# Patient Record
Sex: Female | Born: 1965 | Race: Black or African American | Hispanic: No | Marital: Married | Smoking: Never smoker
Health system: Southern US, Community
[De-identification: ages and names within clinical notes are randomized; demographics above are authoritative.]

## PROBLEM LIST (undated history)

## (undated) DIAGNOSIS — R51 Headache: Secondary | ICD-10-CM

## (undated) DIAGNOSIS — E785 Hyperlipidemia, unspecified: Secondary | ICD-10-CM

## (undated) DIAGNOSIS — T7840XA Allergy, unspecified, initial encounter: Secondary | ICD-10-CM

## (undated) DIAGNOSIS — R519 Headache, unspecified: Secondary | ICD-10-CM

## (undated) HISTORY — DX: Headache: R51

## (undated) HISTORY — DX: Headache, unspecified: R51.9

## (undated) HISTORY — PX: APPENDECTOMY: SHX54

## (undated) HISTORY — PX: UTERINE FIBROID SURGERY: SHX826

## (undated) HISTORY — DX: Hyperlipidemia, unspecified: E78.5

## (undated) HISTORY — DX: Allergy, unspecified, initial encounter: T78.40XA

---

## 2010-07-21 ENCOUNTER — Emergency Department (HOSPITAL_COMMUNITY)
Admission: EM | Admit: 2010-07-21 | Discharge: 2010-07-21 | Disposition: A | Discharge status: Home / Self Care | Payer: Self-pay | Attending: Emergency Medicine | Admitting: Emergency Medicine

## 2010-07-21 DIAGNOSIS — N898 Other specified noninflammatory disorders of vagina: Secondary | ICD-10-CM | POA: Insufficient documentation

## 2012-09-16 ENCOUNTER — Ambulatory Visit (INDEPENDENT_AMBULATORY_CARE_PROVIDER_SITE_OTHER): Payer: Managed Care, Other (non HMO) | Admitting: Family Medicine

## 2012-09-16 VITALS — BP 128/84 | HR 84 | Temp 98.7°F | Resp 18 | Ht 67.0 in | Wt 237.0 lb

## 2012-09-16 DIAGNOSIS — J019 Acute sinusitis, unspecified: Secondary | ICD-10-CM

## 2012-09-16 DIAGNOSIS — J309 Allergic rhinitis, unspecified: Secondary | ICD-10-CM

## 2012-09-16 MED ORDER — PREDNISONE 10 MG PO TABS: ORAL_TABLET | ORAL | Status: DC

## 2012-09-16 MED ORDER — FLUTICASONE PROPIONATE 50 MCG/ACT NA SUSP: 2.0000 | Freq: Every day | NASAL | Status: DC

## 2012-09-16 MED ORDER — AMOXICILLIN-POT CLAVULANATE 875-125 MG PO TABS: 1.0000 | ORAL_TABLET | Freq: Two times a day (BID) | ORAL | Status: DC

## 2012-09-16 NOTE — Progress Notes (Signed)
  [facility]:  Office Visit  Chief Complaint:  Chief Complaint  Patient presents with  . Nasal Congestion    runny nose/sneezing/ cough    HPI: Grace Quinn is a 47 y.o. female who complains of  Had URI sxs. Chills, fevers, cough x 1 week. Eye pain, swelling, watery dc. + allergies. From [place]. Has had sxs like pollen. Has taken Claritin everyday for 1 week. No releif. This morning took advil. + clear sputum.   Past Medical History  Diagnosis Date  . Allergy    Past Surgical History  Procedure Laterality Date  . Uterine fibroid surgery     History   Social History  . Marital Status: Single    Spouse Name: N/A    Number of Children: N/A  . Years of Education: N/A   Social History Main Topics  . Smoking status: Never Smoker   . Smokeless tobacco: None  . Alcohol Use: None  . Drug Use: None  . Sexually Active: None   Other Topics Concern  . None   Social History Narrative  . None   History reviewed. No pertinent family history. No Known Allergies Prior to Admission medications   Medication Sig Start Date End Date Taking? Authorizing Provider  ibuprofen (ADVIL,MOTRIN) 200 MG tablet Take 200 mg by mouth every 6 (six) hours as needed for pain.   Yes Historical Provider, MD     ROS: The patient denies fevers, chills, night sweats, unintentional weight loss, chest pain, palpitations, wheezing, dyspnea on exertion, nausea, vomiting, abdominal pain, dysuria, hematuria, melena, numbness, weakness, or tingling.   All other systems have been reviewed and were otherwise negative with the exception of those mentioned in the HPI and as above.    PHYSICAL EXAM: Filed Vitals:   09/16/12 1622  BP: 128/84  Pulse: 84  Temp: 98.7 F (37.1 C)  Resp: 18   Filed Vitals:   09/16/12 1622  Height: 5\' 7"  (1.702 m)  Weight: 237 lb (107.502 kg)   Body mass index is 37.11 kg/(m^2).  General: Alert, no acute distress HEENT:  Normocephalic, atraumatic,  oropharynx patent. + sinus draiange, , tenderness, + boggy nares. Tm nl,. No exudates Cardiovascular:  Regular rate and rhythm, no rubs murmurs or gallops.  No Carotid bruits, radial pulse intact. No pedal edema.  Respiratory: Clear to auscultation bilaterally.  No wheezes, rales, or rhonchi.  No cyanosis, no use of accessory musculature GI: No organomegaly, abdomen is soft and non-tender, positive bowel sounds.  No masses. Skin: No rashes. Neurologic: Facial musculature symmetric. Psychiatric: Patient is appropriate throughout our interaction. Lymphatic: No cervical lymphadenopathy Musculoskeletal: Gait intact.   LABS: Results for orders placed during the hospital encounter of 07/21/10  POCT PREGNANCY, URINE      Result Value Range   Preg Test, Ur       Value: NEGATIVE            THE SENSITIVITY OF THIS     METHODOLOGY IS >24 mIU/mL     EKG/XRAY:   Primary read interpreted by Dr. Conley Rolls at [facility]   ASSESSMENT/PLAN: Encounter Diagnoses  Name Primary?  . Acute sinusitis Yes  . Allergic rhinitis     Rx Augmentin, Flonase and Prednisone Taper.  F/u prn   Grace Lecompte PHUONG, DO 09/16/2012 5:00 PM

## 2013-02-08 ENCOUNTER — Encounter: Payer: Self-pay | Admitting: Obstetrics & Gynecology

## 2013-02-24 ENCOUNTER — Ambulatory Visit: Payer: Managed Care, Other (non HMO) | Admitting: Obstetrics

## 2013-02-24 ENCOUNTER — Ambulatory Visit (INDEPENDENT_AMBULATORY_CARE_PROVIDER_SITE_OTHER): Payer: Managed Care, Other (non HMO) | Admitting: Obstetrics

## 2013-02-24 ENCOUNTER — Encounter: Payer: Self-pay | Admitting: Obstetrics

## 2013-02-24 VITALS — BP 137/92 | HR 86 | Temp 98.4°F | Ht 65.0 in | Wt 237.4 lb

## 2013-02-24 DIAGNOSIS — R51 Headache: Secondary | ICD-10-CM

## 2013-02-24 DIAGNOSIS — Z0000 Encounter for general adult medical examination without abnormal findings: Secondary | ICD-10-CM

## 2013-02-24 DIAGNOSIS — R87610 Atypical squamous cells of undetermined significance on cytologic smear of cervix (ASC-US): Secondary | ICD-10-CM

## 2013-02-24 DIAGNOSIS — R519 Headache, unspecified: Secondary | ICD-10-CM | POA: Insufficient documentation

## 2013-02-24 MED ORDER — PNV PRENATAL PLUS MULTIVITAMIN 27-1 MG PO TABS: 1.0000 | ORAL_TABLET | Freq: Every day | ORAL | Status: DC

## 2013-02-24 MED ORDER — BUTALBITAL-APAP-CAFFEINE 50-325-40 MG PO TABS: 2.0000 | ORAL_TABLET | Freq: Four times a day (QID) | ORAL | Status: DC | PRN

## 2013-02-24 NOTE — Progress Notes (Signed)
.   Subjective:     Grace Quinn is a 47 y.o. female here for a problem exam.  Current complaints: patient is here today for a consult regarding her abnormal pap smear done 02/01/13 at [facility].  Personal health questionnaire reviewed: yes.   Gynecologic History No LMP recorded. Patient is not currently having periods (Reason: Perimenopausal). Contraception: none Last Pap: 02/01/2013. Results were: abnormal Last mammogram: 2013. Results were: normal  Obstetric History OB History  No data available     The following portions of the patient's history were reviewed and updated as appropriate: allergies, current medications, past family history, past medical history, past social history, past surgical history and problem list.  Review of Systems Pertinent items are noted in HPI.    Objective:    PE:  Omitted.  Consult only.  Assessment:    [facility] Pap Smear.  Headache.   Plan:    Education reviewed: Management of abnormal pap smears.. Follow up in: 6 months.   Repeat Pap q 6 Months. Fioricet Rx.

## 2013-07-26 ENCOUNTER — Ambulatory Visit: Payer: Managed Care, Other (non HMO) | Admitting: Obstetrics

## 2013-10-07 ENCOUNTER — Ambulatory Visit (INDEPENDENT_AMBULATORY_CARE_PROVIDER_SITE_OTHER): Payer: Managed Care, Other (non HMO) | Admitting: Physician Assistant

## 2013-10-07 VITALS — BP 110/70 | HR 72 | Temp 97.6°F | Resp 16 | Ht 66.0 in | Wt 235.0 lb

## 2013-10-07 DIAGNOSIS — R519 Headache, unspecified: Secondary | ICD-10-CM

## 2013-10-07 DIAGNOSIS — H1010 Acute atopic conjunctivitis, unspecified eye: Secondary | ICD-10-CM

## 2013-10-07 DIAGNOSIS — J309 Allergic rhinitis, unspecified: Secondary | ICD-10-CM

## 2013-10-07 DIAGNOSIS — R51 Headache: Secondary | ICD-10-CM

## 2013-10-07 MED ORDER — BUTALBITAL-APAP-CAFFEINE 50-325-40 MG PO TABS: 2.0000 | ORAL_TABLET | Freq: Three times a day (TID) | ORAL | Status: DC | PRN

## 2013-10-07 MED ORDER — PREDNISONE 50 MG PO TABS: 50.0000 mg | ORAL_TABLET | Freq: Every day | ORAL | Status: DC

## 2013-10-07 MED ORDER — MOMETASONE FUROATE 50 MCG/ACT NA SUSP: 2.0000 | Freq: Every day | NASAL | Status: DC

## 2013-10-07 MED ORDER — AZELASTINE HCL 0.05 % OP SOLN: 1.0000 [drp] | Freq: Two times a day (BID) | OPHTHALMIC | Status: DC

## 2013-10-07 NOTE — Progress Notes (Signed)
   Subjective:    Patient ID: Grace Quinn, female    DOB: 03/28/1966, 48 y.o.   MRN: [id number]  HPI   [name] is a pleasant 48 yr old female here because "I'm sick."  She thinks this may be due allergies.  Symptoms include headache, nasal congestion, runny nose, itchy/watery eyes.  Symptoms have been present 2 wks.  No fever.  No cough.  No sick contacts.  Has been treated for allergies in the past.  Tried Zyrtec D and Flonase without effect.  Tried Claritin today  Also would like refill of Fioricet - takes prn for headaches   Review of Systems  Constitutional: Negative for fever and chills.  HENT: Positive for congestion, rhinorrhea, sinus pressure and sneezing. Negative for ear pain and sore throat.   Respiratory: Negative for cough, shortness of breath and wheezing.   Cardiovascular: Negative.   Gastrointestinal: Negative.   Musculoskeletal: Negative.   Skin: Negative.   Neurological: Positive for headaches.       Objective:   Physical Exam  Vitals reviewed. Constitutional: She is oriented to person, place, and time. She appears well-developed and well-nourished. No distress.  HENT:  Head: Normocephalic and atraumatic.  Right Ear: Tympanic membrane and ear canal normal.  Left Ear: Tympanic membrane and ear canal normal.  Nose: Rhinorrhea present.  Mouth/Throat: Uvula is midline, oropharynx is clear and moist and mucous membranes are normal.  Eyes: Conjunctivae are normal. No scleral icterus.  Neck: Neck supple.  Cardiovascular: Normal rate, regular rhythm and normal heart sounds.   Pulmonary/Chest: Effort normal and breath sounds normal. She has no wheezes. She has no rales.  Lymphadenopathy:    She has no cervical adenopathy.  Neurological: She is alert and oriented to person, place, and time.  Skin: Skin is warm and dry.  Psychiatric: She has a normal mood and affect. Her behavior is normal.       Assessment & Plan:  Allergic conjunctivitis and rhinitis - Plan:  mometasone (NASONEX) 50 MCG/ACT nasal spray, azelastine (OPTIVAR) 0.05 % ophthalmic solution, predniSONE (DELTASONE) 50 MG tablet  Headache - Plan: butalbital-acetaminophen-caffeine (FIORICET) 50-325-40 MG per tablet   [name] is a very pleasant 48 yr old female here with allergic rhinitis and conjunctivitis.  Continue Claritin daily.  Switch from Flonase to Nasonex.  Optivar for eye symptoms. Short burst of prednisone.  Fioricet refilled per pt request.    Pt to call or RTC if worsening or not improving  E. Frances FurbishElizabeth Jevonte Clanton MHS, PA-C [facility] 5/14/20158:46 PM

## 2013-10-07 NOTE — Patient Instructions (Signed)
Continue taking the loratadine once daily  Use the mometasone (Nasaonex) 2 sprays each nostril once daily - make sure to use this consistently every day for the best results  Use the azelastine (Optivar) drops twice daily for your eyes  Take prednisone (steroid) for 3 days to help calm down your allergies  If any symptoms are worsening or not improving, please let me know   Allergic Rhinitis Allergic rhinitis is when the mucous membranes in the nose respond to allergens. Allergens are particles in the air that cause your body to have an allergic reaction. This causes you to release allergic antibodies. Through a chain of events, these eventually cause you to release histamine into the blood stream. Although meant to protect the body, it is this release of histamine that causes your discomfort, such as frequent sneezing, congestion, and an itchy, runny nose.  CAUSES  Seasonal allergic rhinitis (hay fever) is caused by pollen allergens that may come from grasses, trees, and weeds. Year-round allergic rhinitis (perennial allergic rhinitis) is caused by allergens such as house dust mites, pet dander, and mold spores.  SYMPTOMS   Nasal stuffiness (congestion).  Itchy, runny nose with sneezing and tearing of the eyes. DIAGNOSIS  Your health care provider can help you determine the allergen or allergens that trigger your symptoms. If you and your health care provider are unable to determine the allergen, skin or blood testing may be used. TREATMENT  Allergic Rhinitis does not have a cure, but it can be controlled by:  Medicines and allergy shots (immunotherapy).  Avoiding the allergen. Hay fever may often be treated with antihistamines in pill or nasal spray forms. Antihistamines block the effects of histamine. There are over-the-counter medicines that may help with nasal congestion and swelling around the eyes. Check with your health care provider before taking or giving this medicine.  If  avoiding the allergen or the medicine prescribed do not work, there are many new medicines your health care provider can prescribe. Stronger medicine may be used if initial measures are ineffective. Desensitizing injections can be used if medicine and avoidance does not work. Desensitization is when a patient is given ongoing shots until the body becomes less sensitive to the allergen. Make sure you follow up with your health care provider if problems continue. HOME CARE INSTRUCTIONS It is not possible to completely avoid allergens, but you can reduce your symptoms by taking steps to limit your exposure to them. It helps to know exactly what you are allergic to so that you can avoid your specific triggers. SEEK MEDICAL CARE IF:   You have a fever.  You develop a cough that does not stop easily (persistent).  You have shortness of breath.  You start wheezing.  Symptoms interfere with normal daily activities. Document Released: 02/05/2001 Document Revised: 03/03/2013 Document Reviewed: 01/18/2013 [facility] Patient Information 2014 [employer] [place].

## 2013-11-13 ENCOUNTER — Other Ambulatory Visit: Payer: Self-pay | Admitting: Physician Assistant

## 2013-11-15 NOTE — Telephone Encounter (Signed)
Rx faxed. LMOM that I faxed rx

## 2014-04-26 ENCOUNTER — Other Ambulatory Visit: Payer: Self-pay | Admitting: Physician Assistant

## 2014-04-26 NOTE — Telephone Encounter (Signed)
Done

## 2014-04-27 NOTE — Telephone Encounter (Signed)
Called in.

## 2014-10-04 ENCOUNTER — Encounter: Payer: Self-pay | Admitting: Internal Medicine

## 2014-10-04 ENCOUNTER — Ambulatory Visit: Payer: PRIVATE HEALTH INSURANCE | Attending: Internal Medicine | Admitting: Internal Medicine

## 2014-10-04 VITALS — BP 119/77 | HR 94 | Temp 97.9°F | Resp 16 | Ht 65.0 in | Wt 231.5 lb

## 2014-10-04 DIAGNOSIS — H6503 Acute serous otitis media, bilateral: Secondary | ICD-10-CM | POA: Diagnosis not present

## 2014-10-04 DIAGNOSIS — J309 Allergic rhinitis, unspecified: Secondary | ICD-10-CM | POA: Diagnosis not present

## 2014-10-04 DIAGNOSIS — J069 Acute upper respiratory infection, unspecified: Secondary | ICD-10-CM

## 2014-10-04 DIAGNOSIS — R232 Flushing: Secondary | ICD-10-CM | POA: Diagnosis not present

## 2014-10-04 DIAGNOSIS — N951 Menopausal and female climacteric states: Secondary | ICD-10-CM | POA: Insufficient documentation

## 2014-10-04 DIAGNOSIS — Z8669 Personal history of other diseases of the nervous system and sense organs: Secondary | ICD-10-CM

## 2014-10-04 DIAGNOSIS — G43909 Migraine, unspecified, not intractable, without status migrainosus: Secondary | ICD-10-CM | POA: Insufficient documentation

## 2014-10-04 MED ORDER — BUTALBITAL-APAP-CAFFEINE 50-500-40 MG PO TABS: 1.0000 | ORAL_TABLET | Freq: Three times a day (TID) | ORAL | Status: DC | PRN

## 2014-10-04 MED ORDER — BUTALBITAL-APAP-CAFFEINE 50-325-40 MG PO TABS: 1.0000 | ORAL_TABLET | Freq: Three times a day (TID) | ORAL | Status: AC | PRN

## 2014-10-04 MED ORDER — FLUTICASONE PROPIONATE 50 MCG/ACT NA SUSP: 2.0000 | Freq: Every day | NASAL | Status: AC

## 2014-10-04 MED ORDER — CETIRIZINE HCL 10 MG PO TABS: 10.0000 mg | ORAL_TABLET | Freq: Every day | ORAL | Status: AC

## 2014-10-04 MED ORDER — AMOXICILLIN-POT CLAVULANATE 875-125 MG PO TABS: 1.0000 | ORAL_TABLET | Freq: Two times a day (BID) | ORAL | Status: DC

## 2014-10-04 NOTE — Addendum Note (Signed)
Addended by: Allayne StackWINFREE, Anjolaoluwa Siguenza R on: 10/04/2014 05:48 PM   Modules accepted: Orders

## 2014-10-04 NOTE — Progress Notes (Signed)
Pt is here to establish care. Pt states that she has not been feeling well for about a month w/ dizziness, frequent severe headaches and itchy eyes.

## 2014-10-04 NOTE — Progress Notes (Signed)
Patient ID: Grace Quinn, female   DOB: 10/04/1965, 49 y.o.   MRN: [id number]  [id number]  [id number]  DOB - 07/27/1965  CC:  Chief Complaint  Patient presents with  . Establish Care       HPI: Grace Quinn is a 49 y.o. female here today to establish medical care. Patient has no past medical history. Patient has been "feeling sick with a cold" for the past 1 mont. She reports symptoms of headaches, nasal congestion, ear fullness, itchy eyes, and fatigue. She reports that she went to [place] last year for similar complaints and was given eye drops, nasonex, and zyrtec in the past.  Patient also reports a history of migraines and has ran out of her fiorcet. Her headaches are described as right sided, squeezing sensations. She has a very stressful job as a LawyerCNA and believes that is making her headaches worse. She denies nausea, vomiting.  She reports frequent hot flashes, she has not had a menstrual cycle in 2 years.   Patient has No headache, No chest pain, No abdominal pain - No Nausea, No new weakness tingling or numbness, No Cough - SOB.  No Known Allergies Past Medical History  Diagnosis Date  . Allergy    Current Outpatient Prescriptions on File Prior to Visit  Medication Sig Dispense Refill  . ibuprofen (ADVIL,MOTRIN) 200 MG tablet Take 200 mg by mouth every 6 (six) hours as needed for pain.    . Prenatal Vit-Fe Fumarate-FA (PNV PRENATAL PLUS MULTIVITAMIN) 27-1 MG TABS Take 1 tablet by mouth daily before breakfast. 30 tablet 11  . azelastine (OPTIVAR) 0.05 % ophthalmic solution Place 1 drop into both eyes 2 (two) times daily. (Patient not taking: Reported on 10/04/2014) 6 mL 12  . butalbital-acetaminophen-caffeine (FIORICET, ESGIC) 50-325-40 MG per tablet TAKE 2 TABLETS BY MOUTH EVERY 8 HOURS AS NEEDED FOR HEADACHES (Patient not taking: Reported on 10/04/2014) 30 tablet 0  . mometasone (NASONEX) 50 MCG/ACT nasal spray Place 2 sprays into the nose daily. (2 sprays each  nostril) (Patient not taking: Reported on 10/04/2014) 17 g 12  . predniSONE (DELTASONE) 50 MG tablet Take 1 tablet (50 mg total) by mouth daily with breakfast. (Patient not taking: Reported on 10/04/2014) 3 tablet 0   No current facility-administered medications on file prior to visit.   Family History  Problem Relation Age of Onset  . Hypertension Mother    History   Social History  . Marital Status: Single    Spouse Name: N/A  . Number of Children: N/A  . Years of Education: N/A   Occupational History  . Not on file.   Social History Main Topics  . Smoking status: Never Smoker   . Smokeless tobacco: Not on file  . Alcohol Use: No  . Drug Use: No  . Sexual Activity: Yes   Other Topics Concern  . Not on file   Social History Narrative    Review of Systems: See HPI   Objective:   Filed Vitals:   10/04/14 1424  BP: 119/77  Pulse: 94  Temp: 97.9 F (36.6 C)  Resp: 16    Physical Exam  Constitutional: She is oriented to person, place, and time.  HENT:  Mouth/Throat: Oropharynx is clear and moist.  Bilateral serous otitis media No sinus tenderness  Eyes:  Bilateral swollen nasal turbinates  Cardiovascular: Normal rate, regular rhythm and normal heart sounds.   Pulmonary/Chest: Effort normal and breath sounds normal.  Lymphadenopathy:    She has cervical  adenopathy.  Neurological: She is alert and oriented to person, place, and time.  Skin: Skin is warm and dry.     No results found for: WBC, HGB, HCT, MCV, PLT No results found for: CREATININE, BUN, NA, K, CL, CO2  No results found for: HGBA1C Lipid Panel  No results found for: CHOL, TRIG, HDL, CHOLHDL, VLDL, LDLCALC     Assessment and plan:   Grace Quinn was seen today for establish care.  Diagnoses and all orders for this visit:  URI (upper respiratory infection) Orders: -     amoxicillin-clavulanate (AUGMENTIN) 875-125 MG per tablet; Take 1 tablet by mouth 2 (two) times daily.  Bilateral  acute serous otitis media, recurrence not specified Patient may use Zyretc D to help with congestion  Allergic rhinitis, unspecified allergic rhinitis type Orders: -     fluticasone (FLONASE) 50 MCG/ACT nasal spray; Place 2 sprays into both nostrils daily. -     cetirizine (ZYRTEC) 10 MG tablet; Take 1 tablet (10 mg total) by mouth daily.  Hx of migraines Orders: -     butalbital-acetaminophen-caffeine (ESGIC PLUS) 50-500-40 MG per tablet; Take 1 tablet by mouth every 8 (eight) hours as needed for pain. Patient will need to keep a headache diary  Hot flashes, menopausal Advised patient to get OTC Black Cohosh. If no improvement she can follow up with [facility].    Return if symptoms worsen or fail to improve, for with [facility]. Patient has requested [facility] as PCP.     [name], [name], NP-C [facility] [phone] 10/04/2014, 2:34 PM

## 2014-10-04 NOTE — Patient Instructions (Addendum)
Black Cohosh---FOR hot flashes/menopausal symptoms

## 2017-04-28 ENCOUNTER — Encounter: Payer: Self-pay | Admitting: Neurology

## 2017-04-29 ENCOUNTER — Encounter: Payer: Self-pay | Admitting: Neurology

## 2017-04-29 ENCOUNTER — Ambulatory Visit: Payer: BLUE CROSS/BLUE SHIELD | Admitting: Neurology

## 2017-04-29 VITALS — BP 143/89 | HR 69 | Ht 64.0 in | Wt 238.0 lb

## 2017-04-29 DIAGNOSIS — G44019 Episodic cluster headache, not intractable: Secondary | ICD-10-CM

## 2017-04-29 DIAGNOSIS — E669 Obesity, unspecified: Secondary | ICD-10-CM | POA: Diagnosis not present

## 2017-04-29 DIAGNOSIS — G4726 Circadian rhythm sleep disorder, shift work type: Secondary | ICD-10-CM

## 2017-04-29 DIAGNOSIS — G43019 Migraine without aura, intractable, without status migrainosus: Secondary | ICD-10-CM | POA: Diagnosis not present

## 2017-04-29 MED ORDER — SUMATRIPTAN SUCCINATE 50 MG PO TABS: 50.0000 mg | ORAL_TABLET | ORAL | 2 refills | Status: AC | PRN

## 2017-04-29 NOTE — Progress Notes (Signed)
SLEEP MEDICINE CLINIC   Provider:  Melvyn Novasarmen  Lannette Avellino, [place]  Primary Care Physician:  Karle PlumberArvind, Moogali M, MD   Referring Provider: Karle PlumberArvind, Moogali M, [facility]   Chief Complaint  Patient presents with  . New Patient (Initial Visit)    pt alone rm 11. pt states that she has been having difficulties with headaches and also snores in her sleep.     HPI:  Grace Quinn is a 51 y.o. female , seen here  in a referral from Dr. 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   [facility] with a chief complaint of severe headaches at the time.  These headaches have been going on for 2-3 days/week severe described in intensity, photophobia and weakness, but no nausea or vomiting.  The pain is described as sharp, tight, throbbing and sometimes almost explosive.  She was given Imitrex by her physician and has not been further evaluated.  She feels that sleep deprivation or poor night of sleep is No.1 of the trigger factors for headaches.   Sleep habits are as follows: she works 11 PM to 7 AM , and works 4 times a week.  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 She lives very close to her workplace and is usually home by 715, she is unable to initiate sleep at that time and mostly waits until about 2 PM when she has a higher chance of actually falling asleep.  Once asleep she will sleep for about 4-5 hours in daytime. She states that she is not completely able to block daylight penetrating the window, but her bedroom is cool and quiet otherwise.  The light seems to wake her and makes it hard to maintain sleep. She rises at 10 PM and starts to get ready for work again. First to sleep on her side, uses a lot of pillows 3 or 4 for head support. She does not snore.    Sleep medical history and family sleep history:  No family history of sleep disorders known.    Social  history:  She lives with her husband, no children.  Night shift worker.  No history of smoking, no ETOH, Caffeine : 1 cup a day. No tea, soda or energy drinks.     Review of Systems: Out of a complete 14 system review, the patient complains of only the following symptoms, and all other reviewed systems are negative.  insomnia, in daytime.  FSS 40, Epworth 10 , headaches.    Social History   Socioeconomic History  . Marital status: Single    Spouse name: Not on file  . Number of children: Not on file  . Years of education: Not on file  . Highest education level: Not on file  Social Needs  . Financial resource strain: Not on file  . Food insecurity - worry: Not on file  . Food insecurity - inability: Not on file  . Transportation needs - medical: Not on file  . Transportation needs - non-medical: Not on file  Occupational History  . Not on file  Tobacco Use  . Smoking status: Never Smoker  . Smokeless tobacco: Never Used  Substance and Sexual Activity  . Alcohol use: No  . Drug use: No  . Sexual activity: Yes  Other Topics Concern  . Not on file  Social History Narrative  . Not on file  Family History  Problem Relation Age of Onset  . Hypertension Mother     Past Medical History:  Diagnosis Date  . Allergy   . Headache   . Hyperlipemia     Past Surgical History:  Procedure Laterality Date  . APPENDECTOMY    . APPENDECTOMY    . UTERINE FIBROID SURGERY      Current Outpatient Medications  Medication Sig Dispense Refill  . cetirizine (ZYRTEC) 10 MG tablet Take 1 tablet (10 mg total) by mouth daily. 30 tablet 11  . fluticasone (FLONASE) 50 MCG/ACT nasal spray Place 2 sprays into both nostrils daily. 16 g 6  . ibuprofen (ADVIL,MOTRIN) 200 MG tablet Take 200 mg by mouth every 6 (six) hours as needed for pain.    . SUMAtriptan (IMITREX) 50 MG tablet Take 50 mg by mouth every 2 (two) hours as needed for migraine. May repeat in 2 hours if headache persists or  recurs.     No current facility-administered medications for this visit.     Allergies as of 04/29/2017  . (No Known Allergies)    Vitals: BP (!) 143/89   Pulse 69   Ht 5\' 4"  (1.626 m)   Wt 238 lb (108 kg)   BMI 40.85 kg/m  Last Weight:  Wt Readings from Last 1 Encounters:  04/29/17 238 lb (108 kg)   AVW:UJWJBMI:Body mass index is 40.85 kg/m.     Last Height:   Ht Readings from Last 1 Encounters:  04/29/17 5\' 4"  (1.626 m)    Physical exam:  General: The patient is awake, alert and appears not in acute distress. The patient is well groomed. Head: Normocephalic, atraumatic. Neck is supple. Mallampati 5, large tongue,  neck circumference: 15. Nasal airflow patent,  Retrognathia is seen.  Cardiovascular:  Regular rate and rhythm , without  murmurs or carotid bruit, and without distended neck veins. Respiratory: Lungs are clear to auscultation. Skin:  Without evidence of edema, or rash Trunk: BMI is 41. The patient's posture is erect   Neurologic exam :   Cranial nerves: Pupils are equal and briskly reactive to light. Extraocular movements  in vertical and horizontal planes intact and without nystagmus. Visual fields by finger perimetry are intact.Hearing to finger rub intact.  Facial sensation intact to fine touch. Facial motor strength is symmetric and tongue and uvula move midline. Shoulder shrug was symmetrical.   Motor exam: Normal tone, muscle bulk and symmetric strength in all extremities. Sensory:  Fine touch, pinprick and vibration were tested in all extremities. Proprioception tested in the upper extremities was normal. Coordination: Rapid alternating movements in the fingers/hands was normal. Finger-to-nose maneuver  normal without evidence of ataxia, dysmetria or tremor. Gait and station: Patient walks without assistive device and is able unassisted to climb up to the exam table. Strength within normal limits. Stance is stable and normal. Deep tendon reflexes: in the  upper  and lower extremities are symmetric and intact. Babinski maneuver response is downgoing.  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   2) OSA?  the patient does not believe that she snores, but she does have some anatomical risk factors for the presence of sleep apnea such as a body mass index, high-grade Mallampati, however her neck circumference is in normal range.  There are not many comorbidities that could affect her sleep here but sleeplessness may affect her headache history.   3) she reports that she wakes up not just with headaches but is woken by headaches, this would qualify as a cluster headache and is often considered a subtype of migraine.  She has found relief with sumatriptan prescribed by her referring physician, Dr. Kathrynn Speed.  A sleep study should be performed in the daytime to mimic the patient's sleep rhythm and should include evaluation for peak hypercapnia with a limit of 50 torr, and hypoxemia evaluation.  I will order this is a split-night polysomnography.   The patient was advised of the nature of the diagnosed disorder , the treatment options and the  risks for general health and wellness arising from not treating the condition.   I spent more than 50 minutes of face to face time with the patient.  Greater than 50% of time was spent in counseling and coordination of care. We have discussed the diagnosis and differential and I answered the patient's questions.    Plan:  Treatment plan and additional workup :  Split-night  polysomnography performed in the daytime for a night shift worker with migraine and cluster headaches, circadian rhythm disorder, insomnia, sleep deprivation headaches.   Melvyn Novas, MD 04/29/2017, 2:16 PM  Certified in Neurology by ABPN Certified in Sleep Medicine by [facility] [address], [address] [place], [place] [address]

## 2017-04-29 NOTE — Patient Instructions (Addendum)
Try Melatonin OTC at 5 mg or less before going to sleep.  Please remember to try to maintain good sleep hygiene, which means: Keep a regular sleep and wake schedule, try not to exercise or have a meal within 2 hours of your bedtime, try to keep your bedroom conducive for sleep, that is, cool and dark, without light distractors such as an illuminated alarm clock, and refrain from watching TV right before sleep or in the middle of the night and do not keep the TV or radio on during the night. Also, try not to use or play on electronic devices at bedtime, such as your cell phone, tablet PC or laptop. If you like to read at bedtime on an electronic device, try to dim the background light as much as possible. Do not eat in the middle of the night.   We will request a sleep study.    We will look for leg twitching and snoring or sleep apnea.   For chronic insomnia, you are best followed by a psychiatrist and/or sleep psychologist.   We will call you with the sleep study results and make a follow up appointment if needed.

## 2017-05-01 ENCOUNTER — Telehealth: Payer: Self-pay

## 2017-05-01 ENCOUNTER — Other Ambulatory Visit: Payer: Self-pay | Admitting: Neurology

## 2017-05-01 DIAGNOSIS — G4733 Obstructive sleep apnea (adult) (pediatric): Secondary | ICD-10-CM

## 2017-05-01 NOTE — Telephone Encounter (Signed)
Order placed

## 2017-05-01 NOTE — Telephone Encounter (Signed)
[employer] denied in lab study, need HST order. 

## 2017-05-22 ENCOUNTER — Telehealth: Payer: Self-pay | Admitting: Neurology

## 2017-05-22 NOTE — Telephone Encounter (Signed)
We have attempted to call the patient 2 times to schedule sleep study. Patient has been unavailable at the phone numbers we have on file and has not returned our calls. At this point we will send a letter asking pt to please contact the sleep lab to schedule their sleep study. If patient calls back we will schedule them for their sleep study. ° °

## 2017-09-24 ENCOUNTER — Ambulatory Visit (INDEPENDENT_AMBULATORY_CARE_PROVIDER_SITE_OTHER): Payer: BLUE CROSS/BLUE SHIELD | Admitting: Neurology

## 2017-09-24 DIAGNOSIS — G4733 Obstructive sleep apnea (adult) (pediatric): Secondary | ICD-10-CM

## 2017-10-03 NOTE — Procedures (Signed)
[facility] [address] [place], [place] [address] NAME:   Grace Quinn                                                               DOB: 07-12-1965 MEDICAL RECORD No: [id number]                                                DOS: 09/25/2017 REFERRING PHYSICIAN: Barney Drain, M.D. STUDY PERFORMED: Home Sleep Study on Apnea Link  HISTORY: Marquasha Brutus Bouza is a 52 y.o. female patient, seen here in a referral from Dr. 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    The patient presented to [facility] March 12, 2017 with a chief complaint of severe headaches at the time.  She was given Imitrex by her physician and has not been further evaluated.  She feels that sleep deprivation or poor night of sleep is No.1 of the trigger factors for headaches. Epworth sleepiness Score endorsed at 10/24 points, FSS at 40 points. The BMI is 40.8.  STUDY RESULTS:  Total Recording Time: 8 hours, 45 minutes. Valid Test time was 5 h and 45 minutes. Total Apnea/Hypopnea Index (AHI):  8.9 /h, RDI: 11.6/h Average Oxygen Saturation: 94 %, Lowest Oxygen Saturation: 74 %.  Total Time in Oxygen Saturation below 89 % was 11.0 minutes.  Average Heart Rate: 60 bpm. IMPRESSION: Rather mild apnea with moderate snoring, brief desaturations and normal heart rate. It is unlikely that this mild apnea is causing headaches. 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  RECOMMENDATION: No Intervention needed (CPAP, Medication, Oxygen). Recommend weight loss, improvement of nasal patency, and creating an environment conducive to sleep in daytime ( "Dark, Cool and Quiet"). May try melatonin one hour prior to intended sleep time, 5 mg or less)   Return to PCP.  I certify that I have reviewed the raw data recording prior to the issuance of this report in accordance with the standards of the American Academy of Sleep  Medicine (AASM). Melvyn Novas, M.D.    10-03-2017    Medical Director of [facility], accredited by the AASM. Diplomat of the ABPN and ABSM.

## 2017-10-07 ENCOUNTER — Telehealth: Payer: Self-pay | Admitting: Neurology

## 2017-10-07 NOTE — Telephone Encounter (Signed)
Called patient to discuss sleep study results. No answer at this time. LVM for the patient to call back.   

## 2017-10-07 NOTE — Telephone Encounter (Signed)
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  She would not need to follow up in the sleep clinic , PCP to discuss sleep hygiene with her. CD   Dr Kathrynn Speed

## 2017-10-07 NOTE — Telephone Encounter (Signed)
Pt called back and I reviewed her sleep study with her. Informed her that there was no sleep apnea or low oxygen that needed treatment with CPAP intervention. I went over sleep hygiene with her to make sure when she is asleep its in a cool, dark, quiet room. I informed her that Dr Vickey Huger recommended melatonin over the counter that she could take to help with sleep about a hour before she laid down. This could be 5 mg or less over the counter melatonin. Pt verbalized understanding. Informed her no need to follow up in the sleep lab and pt had no questions or concerns. I encouraged the pt to follow up with PCP. Pt verbalized understanding.

## 2017-10-07 NOTE — Telephone Encounter (Signed)
Pt returned call but when I answered there was a loss of connection. I don't know if they were able to hear but I stated that if they could hear me I was going to hang up and call back. I hung the phone up and called them back and there was no answer. Will wait for them to try back.

## 2017-10-27 ENCOUNTER — Other Ambulatory Visit: Payer: Self-pay | Admitting: Obstetrics & Gynecology

## 2017-10-27 DIAGNOSIS — R928 Other abnormal and inconclusive findings on diagnostic imaging of breast: Secondary | ICD-10-CM

## 2017-10-29 ENCOUNTER — Ambulatory Visit
Admission: RE | Admit: 2017-10-29 | Discharge: 2017-10-29 | Disposition: A | Discharge status: Home / Self Care | Payer: BLUE CROSS/BLUE SHIELD | Source: Ambulatory Visit | Attending: Obstetrics & Gynecology | Admitting: Obstetrics & Gynecology

## 2017-10-29 ENCOUNTER — Ambulatory Visit: Payer: PRIVATE HEALTH INSURANCE

## 2017-10-29 DIAGNOSIS — R928 Other abnormal and inconclusive findings on diagnostic imaging of breast: Secondary | ICD-10-CM

## 2018-08-24 IMAGING — MG DIGITAL DIAGNOSTIC UNILATERAL RIGHT MAMMOGRAM WITH TOMO AND CAD
6 series · 6 of 18 positions shown · non-contrast
Comparison: Screening mammogram dated 10/23/2017.

CLINICAL DATA: Screening recall for possible mass seen in the right
breast on MLO view only.

EXAM:
DIGITAL DIAGNOSTIC UNILATERAL RIGHT MAMMOGRAM WITH CAD AND TOMO

[R XCCL synth-2D]
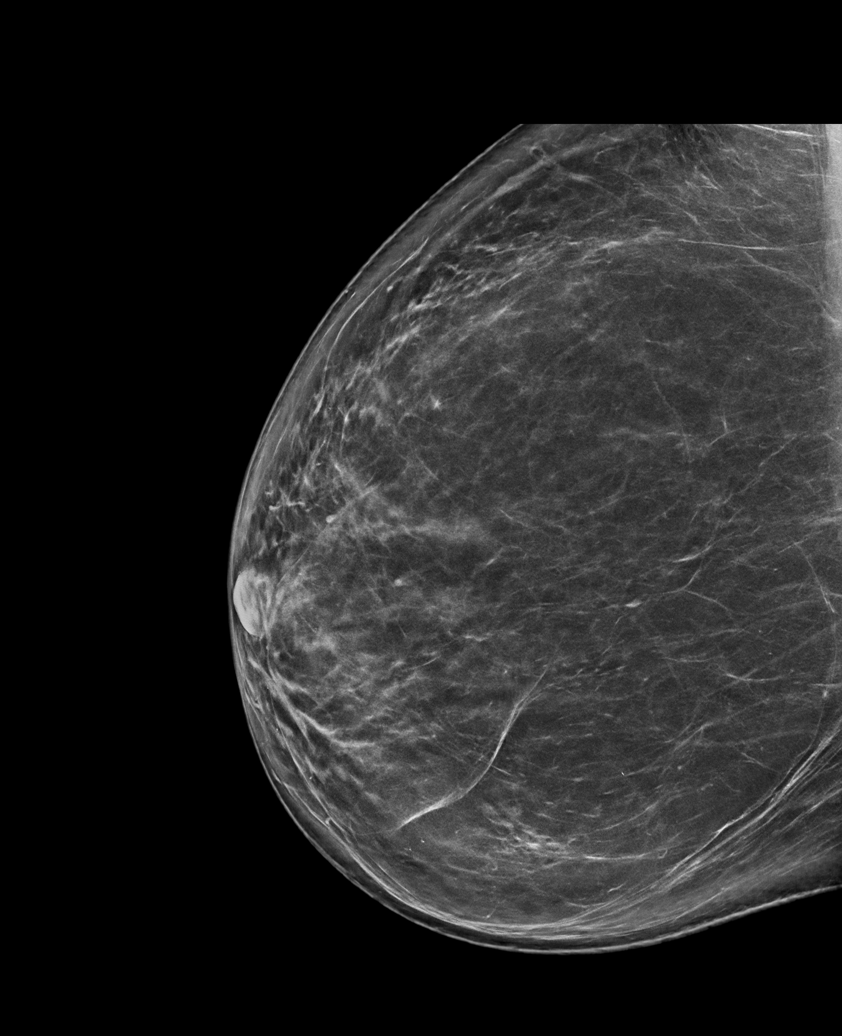

[R MLO synth-2D]
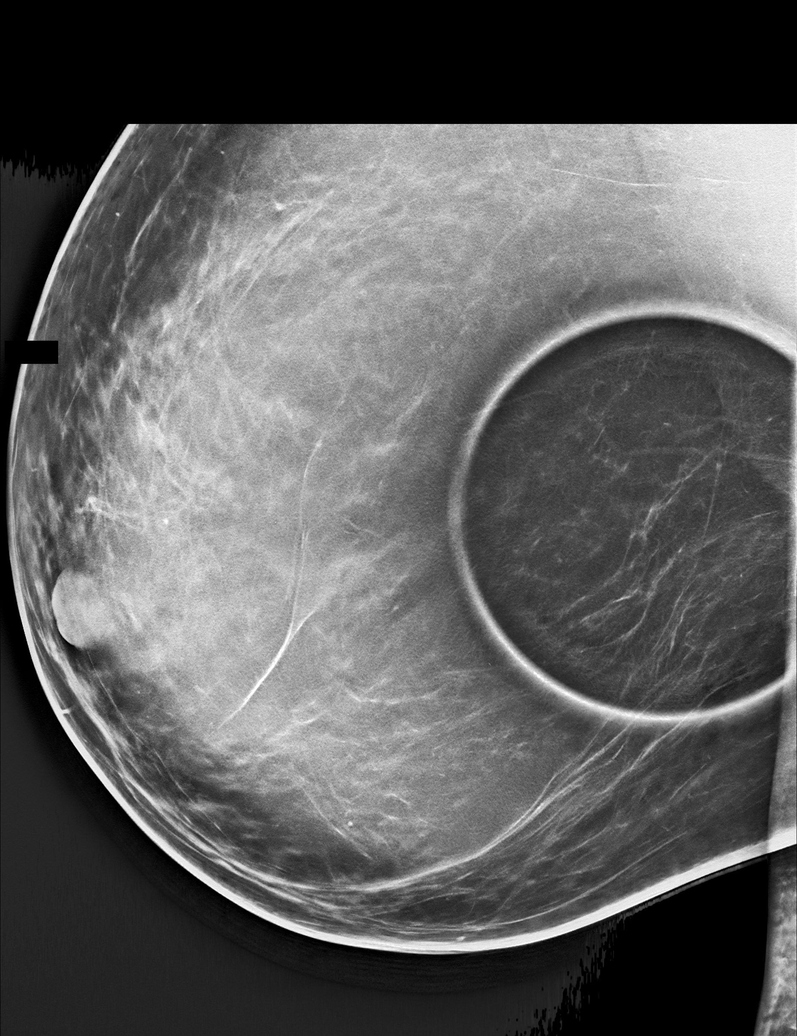

[R ML synth-2D]
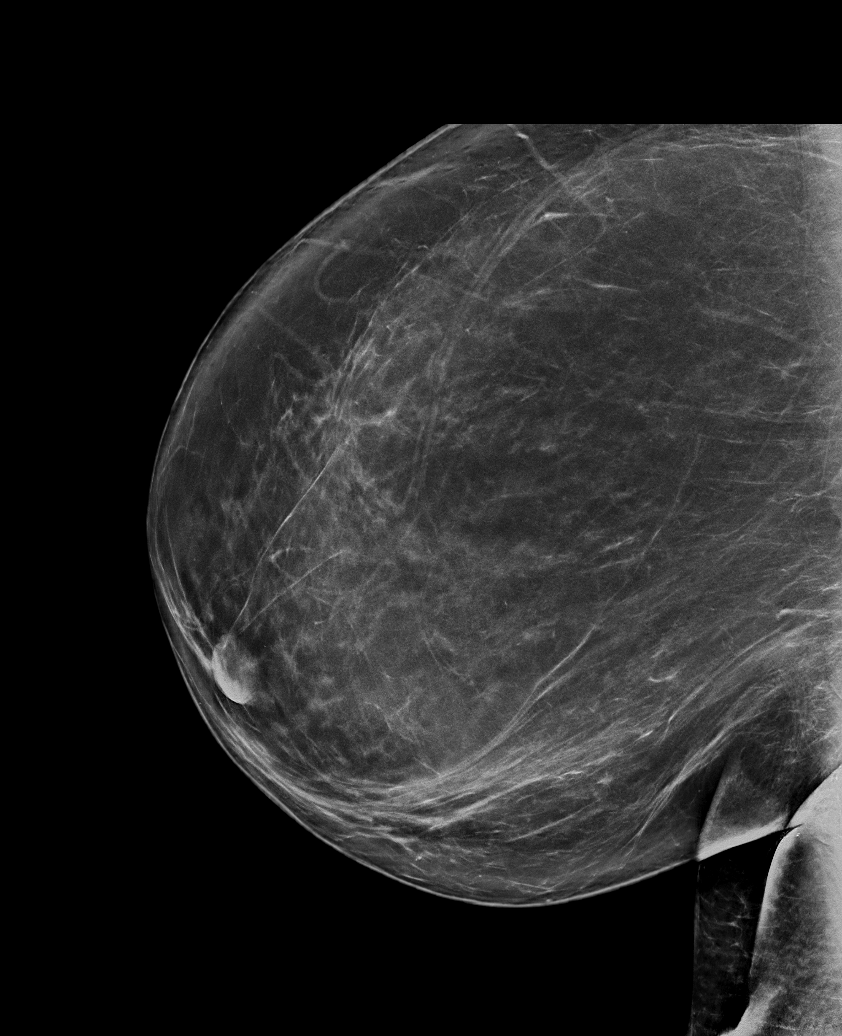

[R ML tomo · tomo slice 49/98.0]
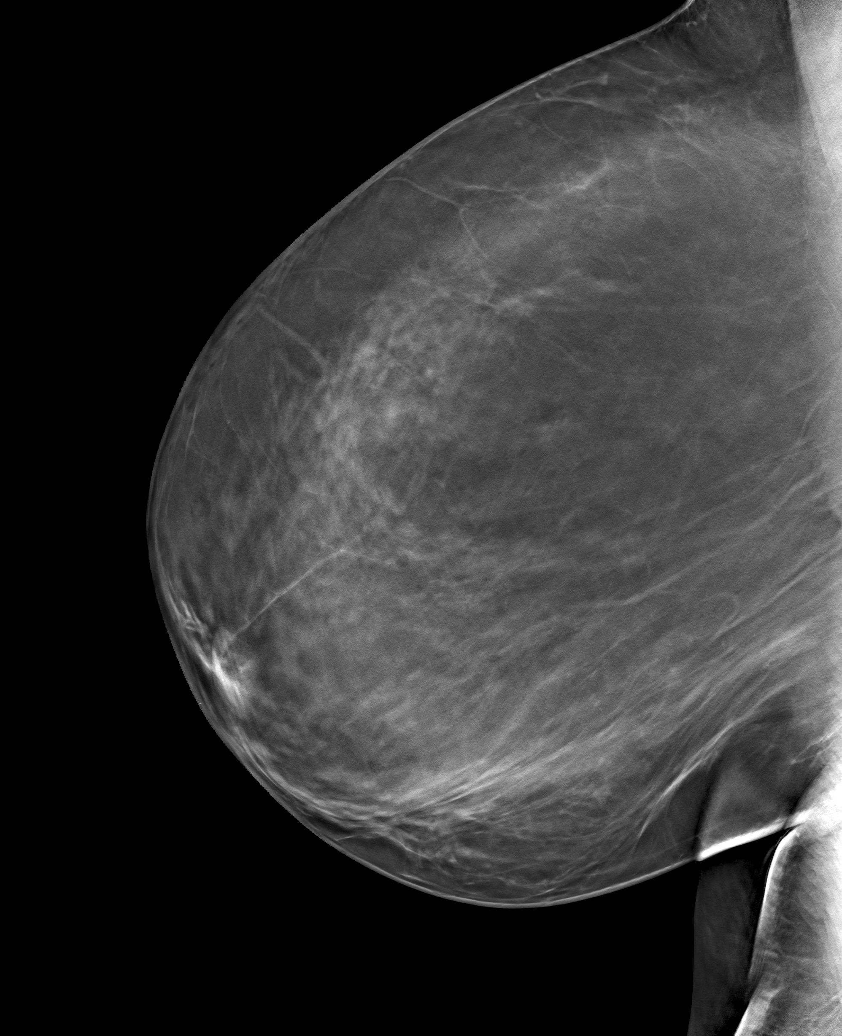

[R MLO tomo · tomo slice 41/81.0]
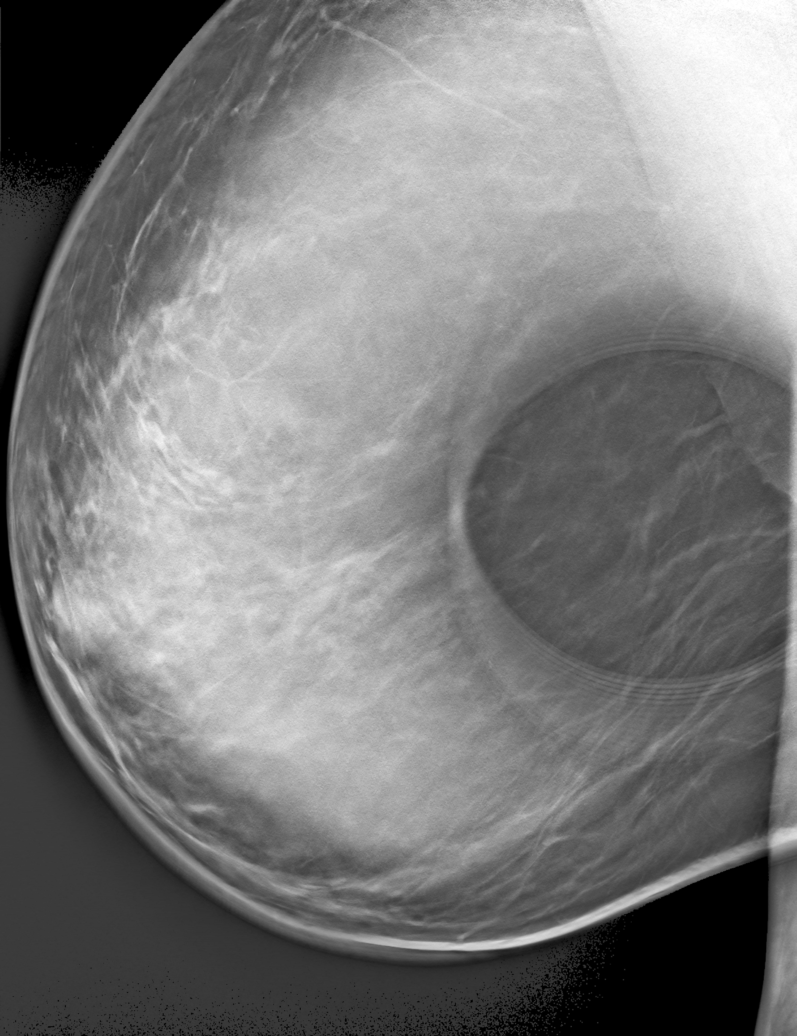

[R XCCL tomo · tomo slice 47/93.0]
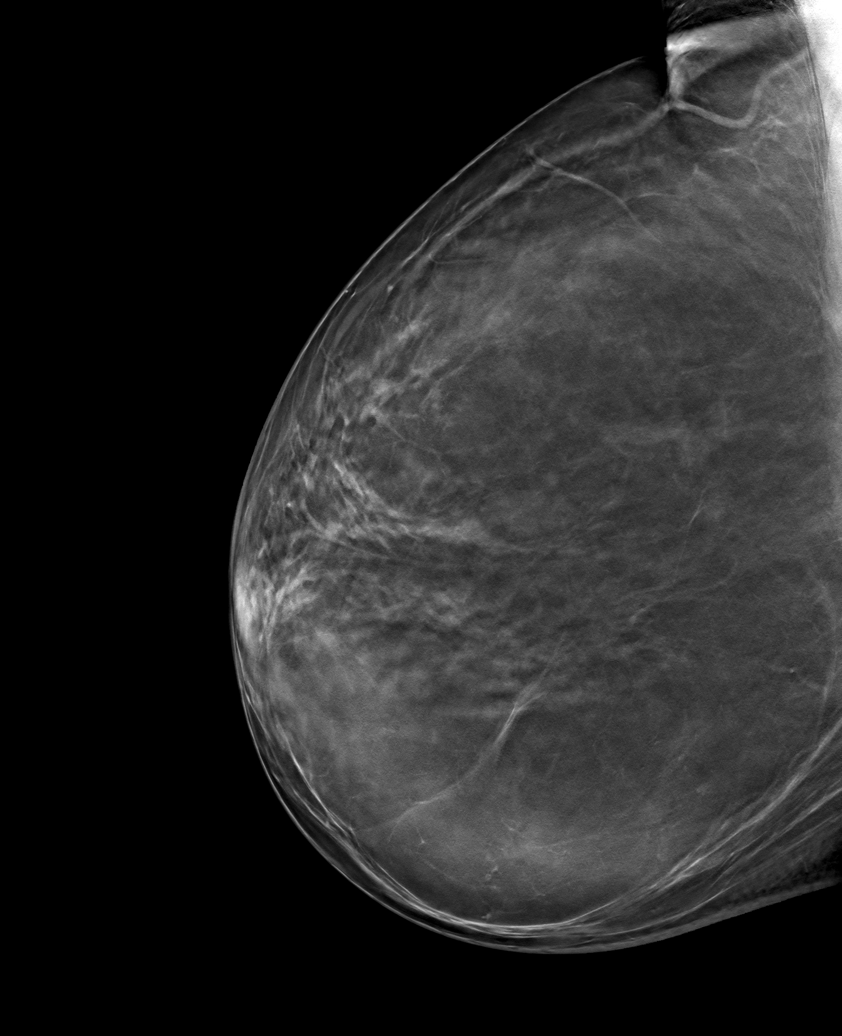

[6 of 18 positions shown; findings below may reference images not displayed]

ACR Breast Density Category b: There are scattered areas of
fibroglandular density.
FINDINGS: Additional tomograms were performed of the right breast. The
initially questioned possible right breast mass appears to resolve
on the additional imaging with findings compatible with overlapping
fibroglandular tissue. There is no mammographic evidence of
malignancy in the right breast.

Mammographic images were processed with CAD.
IMPRESSION: No mammographic evidence of malignancy in the right breast.

RECOMMENDATION:
Recommend annual routine screening mammography due September 2018.

I have discussed the findings and recommendations with the patient.
Results were also provided in writing at the conclusion of the
visit. If applicable, a reminder letter will be sent to the patient
regarding the next appointment.

BI-RADS CATEGORY  1: Negative.

## 2018-11-30 ENCOUNTER — Emergency Department (HOSPITAL_COMMUNITY)
Admission: EM | Admit: 2018-11-30 | Discharge: 2018-12-01 | Disposition: A | Discharge status: Home / Self Care | Payer: No Typology Code available for payment source | Attending: Emergency Medicine | Admitting: Emergency Medicine

## 2018-11-30 ENCOUNTER — Encounter (HOSPITAL_COMMUNITY): Payer: Self-pay

## 2018-11-30 ENCOUNTER — Other Ambulatory Visit: Payer: Self-pay

## 2018-11-30 DIAGNOSIS — S3982XA Other specified injuries of lower back, initial encounter: Secondary | ICD-10-CM | POA: Diagnosis present

## 2018-11-30 DIAGNOSIS — S39012A Strain of muscle, fascia and tendon of lower back, initial encounter: Secondary | ICD-10-CM

## 2018-11-30 DIAGNOSIS — Y929 Unspecified place or not applicable: Secondary | ICD-10-CM | POA: Insufficient documentation

## 2018-11-30 DIAGNOSIS — X500XXA Overexertion from strenuous movement or load, initial encounter: Secondary | ICD-10-CM | POA: Diagnosis not present

## 2018-11-30 DIAGNOSIS — Z79899 Other long term (current) drug therapy: Secondary | ICD-10-CM | POA: Insufficient documentation

## 2018-11-30 DIAGNOSIS — Y990 Civilian activity done for income or pay: Secondary | ICD-10-CM | POA: Insufficient documentation

## 2018-11-30 DIAGNOSIS — Y93F2 Activity, caregiving, lifting: Secondary | ICD-10-CM | POA: Diagnosis not present

## 2018-11-30 MED ORDER — NAPROXEN 500 MG PO TABS: 500.0000 mg | ORAL_TABLET | Freq: Two times a day (BID) | ORAL | 0 refills | Status: DC

## 2018-11-30 MED ORDER — TRAMADOL HCL 50 MG PO TABS: 50.0000 mg | ORAL_TABLET | Freq: Three times a day (TID) | ORAL | 0 refills | Status: AC | PRN

## 2018-11-30 MED ORDER — METHOCARBAMOL 500 MG PO TABS: 500.0000 mg | ORAL_TABLET | Freq: Three times a day (TID) | ORAL | 0 refills | Status: DC | PRN

## 2018-11-30 MED ORDER — KETOROLAC TROMETHAMINE 30 MG/ML IJ SOLN
30.0000 mg | Freq: Once | INTRAMUSCULAR | Status: AC
  Administered 2018-11-30: 30 mg via INTRAMUSCULAR
  Filled 2018-11-30: qty 1

## 2018-11-30 NOTE — ED Triage Notes (Signed)
Pt arrives POV for eval of back pain sustained while lifting a pt at work yesterday. States that she noted the pain worsened overnight, now having worsening lower back pain today. Denies  N/T, bowel/bladder incontinence, neuro intact.

## 2018-11-30 NOTE — Discharge Instructions (Signed)
Alternate ice and heat to areas of injury 3-4 times per day to limit inflammation and spasm.  Avoid strenuous activity and heavy lifting.  We recommend consistent use of naproxen in addition to Robaxin for muscle spasms.  You have been prescribed tramadol to take as needed for severe pain.  Do not drive or drink alcohol after taking this medication as it may make you drowsy and impair your judgment.  We recommend follow-up with a primary care doctor to ensure resolution of symptoms.  Return to the ED for any new or concerning symptoms. 

## 2018-11-30 NOTE — ED Provider Notes (Signed)
[facility]   CSN: [id number] Arrival date & time: 11/30/18  2046     History   Chief Complaint Chief Complaint  Patient presents with  . Back Pain    HPI Grace Pollackatricia Chinwe Dipierro is a 53 y.o. female.     The history is provided by the patient. No language interpreter was used.  Back Pain Location:  Lumbar spine Quality:  Stabbing Radiates to: intermittently to R thigh. Pain severity:  Moderate Onset quality:  Sudden Duration:  2 days Timing:  Constant Progression:  Waxing and waning Chronicity:  New Context comment:  Onset while lifting a patient Relieved by:  Nothing Worsened by:  Ambulation Ineffective treatments:  NSAIDs Associated symptoms: no bladder incontinence, no bowel incontinence, no numbness, no perianal numbness, no tingling and no weakness   Risk factors: obesity     Past Medical History:  Diagnosis Date  . Allergy   . Headache   . Hyperlipemia     Patient Active Problem List   Diagnosis Date Noted  . Obesity (BMI 35.0-39.9 without comorbidity) 04/29/2017  . Episodic cluster headache, not intractable 04/29/2017  . Intractable migraine without aura and without status migrainosus 04/29/2017  . Sleep disorder, circadian, shift work type 04/29/2017  . Papanicolaou smear of cervix with atypical squamous cells of undetermined significance (ASC-US) 02/24/2013  . Headache 02/24/2013    Past Surgical History:  Procedure Laterality Date  . APPENDECTOMY    . APPENDECTOMY    . UTERINE FIBROID SURGERY       OB History   No obstetric history on file.      Home Medications    Prior to Admission medications   Medication Sig Start Date End Date Taking? Authorizing Provider  cetirizine (ZYRTEC) 10 MG tablet Take 1 tablet (10 mg total) by mouth daily. 10/04/14   Ambrose FinlandKeck, [name] A, NP  fluticasone (FLONASE) 50 MCG/ACT nasal spray Place 2 sprays into both nostrils daily. 10/04/14   Ambrose FinlandKeck, [name] A, NP   ibuprofen (ADVIL,MOTRIN) 200 MG tablet Take 200 mg by mouth every 6 (six) hours as needed for pain.    [provider]  methocarbamol (ROBAXIN) 500 MG tablet Take 1 tablet (500 mg total) by mouth every 8 (eight) hours as needed for muscle spasms. 11/30/18   Antony MaduraHumes, Ashonti Leandro, PA-C  naproxen (NAPROSYN) 500 MG tablet Take 1 tablet (500 mg total) by mouth 2 (two) times daily with a meal. 11/30/18   Antony MaduraHumes, Keisean Skowron, PA-C  SUMAtriptan (IMITREX) 50 MG tablet Take 1 tablet (50 mg total) by mouth every 2 (two) hours as needed for migraine. May repeat in 2 hours if headache persists or recurs. 04/29/17   Dohmeier, Porfirio Mylararmen, MD  traMADol (ULTRAM) 50 MG tablet Take 1 tablet (50 mg total) by mouth every 8 (eight) hours as needed for severe pain. 11/30/18   Antony MaduraHumes, Anthonymichael Munday, PA-C    Family History Family History  Problem Relation Age of Onset  . Hypertension Mother     Social History Social History   Tobacco Use  . Smoking status: Never Smoker  . Smokeless tobacco: Never Used  Substance Use Topics  . Alcohol use: No  . Drug use: No     Allergies   Patient has no known allergies.   Review of Systems Review of Systems  Gastrointestinal: Negative for bowel incontinence.  Genitourinary: Negative for bladder incontinence.  Musculoskeletal: Positive for back pain.  Neurological: Negative for tingling, weakness and numbness.  Ten systems reviewed and  are negative for acute change, except as noted in the HPI.    Physical Exam Updated Vital Signs BP 136/82 (BP Location: Right Arm)   Pulse 74   Temp 98.2 F (36.8 C) (Oral)   Resp 16   Ht 5\' 5"  (1.651 m)   Wt 105.2 kg   SpO2 99%   BMI 38.61 kg/m   Physical Exam Vitals signs and nursing note reviewed.  Constitutional:      General: She is not in acute distress.    Appearance: She is well-developed. She is not diaphoretic.     Comments: Nontoxic appearing, obese female.  HENT:     Head: Normocephalic and atraumatic.  Eyes:     General: No  scleral icterus.    Conjunctiva/sclera: Conjunctivae normal.  Neck:     Musculoskeletal: Normal range of motion.  Cardiovascular:     Rate and Rhythm: Normal rate and regular rhythm.     Pulses: Normal pulses.  Pulmonary:     Effort: Pulmonary effort is normal. No respiratory distress.     Comments: Respirations even and unlabored Musculoskeletal: Normal range of motion.     Lumbar back: She exhibits tenderness and pain. She exhibits no deformity and no spasm.       Back:     Comments: Mild positive straight leg raise.  Negative crossed straight leg raise.  Skin:    General: Skin is warm and dry.     Coloration: Skin is not pale.     Findings: No erythema or rash.  Neurological:     Mental Status: She is alert and oriented to person, place, and time.     Comments: Sensation to light touch intact in bilateral lower extremities.  Patient ambulatory with steady gait.  Psychiatric:        Behavior: Behavior normal.      ED Treatments / Results  Labs (all labs ordered are listed, but only abnormal results are displayed) Labs Reviewed - No data to display  EKG None  Radiology No results found.  Procedures Procedures (including critical care time)  Medications Ordered in ED Medications  ketorolac (TORADOL) 30 MG/ML injection 30 mg (30 mg Intramuscular Given 11/30/18 2340)     Initial Impression / Assessment and Plan / ED Course  I have reviewed the triage vital signs and the nursing notes.  Pertinent labs & imaging results that were available during my care of the patient were reviewed by me and considered in my medical decision making (see chart for details).        Patient with back pain with onset yesterday while lifting a patient.  She is neurovascularly intact on exam.  Patient can walk but states is painful.  No loss of bowel or bladder control.  No concern for cauda equina.  No fever, night sweats, hx of CA or IVDU.  RICE protocol and pain medicine indicated and  discussed with patient.  Encouraged f/u with her PCP.  Return precautions discussed and provided. Patient discharged in stable condition with no unaddressed concerns.   Final Clinical Impressions(s) / ED Diagnoses   Final diagnoses:  Strain of lumbar region, initial encounter    ED Discharge Orders         Ordered    naproxen (NAPROSYN) 500 MG tablet  2 times daily with meals     11/30/18 2349    methocarbamol (ROBAXIN) 500 MG tablet  Every 8 hours PRN     11/30/18 2349    traMADol (ULTRAM)  50 MG tablet  Every 8 hours PRN     11/30/18 2349           Antonietta Breach, PA-C 11/30/18 2358    Merryl Hacker, MD 12/05/18 [phone]

## 2019-11-16 ENCOUNTER — Other Ambulatory Visit: Payer: Self-pay | Admitting: Internal Medicine

## 2019-11-16 DIAGNOSIS — Z13820 Encounter for screening for osteoporosis: Secondary | ICD-10-CM

## 2019-11-16 DIAGNOSIS — Z1231 Encounter for screening mammogram for malignant neoplasm of breast: Secondary | ICD-10-CM

## 2019-12-10 ENCOUNTER — Ambulatory Visit: Payer: No Typology Code available for payment source

## 2019-12-28 ENCOUNTER — Other Ambulatory Visit: Payer: Self-pay

## 2019-12-28 ENCOUNTER — Ambulatory Visit
Admission: RE | Admit: 2019-12-28 | Discharge: 2019-12-28 | Disposition: A | Discharge status: Home / Self Care | Payer: 59 | Source: Ambulatory Visit | Attending: Internal Medicine | Admitting: Internal Medicine

## 2019-12-28 DIAGNOSIS — Z1231 Encounter for screening mammogram for malignant neoplasm of breast: Secondary | ICD-10-CM

## 2019-12-31 ENCOUNTER — Other Ambulatory Visit: Payer: Self-pay | Admitting: Internal Medicine

## 2019-12-31 DIAGNOSIS — R928 Other abnormal and inconclusive findings on diagnostic imaging of breast: Secondary | ICD-10-CM

## 2020-01-12 ENCOUNTER — Other Ambulatory Visit: Payer: Self-pay

## 2020-01-12 ENCOUNTER — Other Ambulatory Visit: Payer: Self-pay | Admitting: Internal Medicine

## 2020-01-12 ENCOUNTER — Ambulatory Visit
Admission: RE | Admit: 2020-01-12 | Discharge: 2020-01-12 | Disposition: A | Discharge status: Home / Self Care | Payer: No Typology Code available for payment source | Source: Ambulatory Visit | Attending: Internal Medicine | Admitting: Internal Medicine

## 2020-01-12 DIAGNOSIS — R928 Other abnormal and inconclusive findings on diagnostic imaging of breast: Secondary | ICD-10-CM

## 2020-07-17 ENCOUNTER — Other Ambulatory Visit: Payer: 59

## 2020-08-16 ENCOUNTER — Other Ambulatory Visit: Payer: 59

## 2020-08-31 ENCOUNTER — Other Ambulatory Visit: Payer: Self-pay | Admitting: Internal Medicine

## 2020-08-31 DIAGNOSIS — N6489 Other specified disorders of breast: Secondary | ICD-10-CM

## 2020-09-04 ENCOUNTER — Ambulatory Visit: Payer: 59

## 2020-09-04 ENCOUNTER — Ambulatory Visit
Admission: RE | Admit: 2020-09-04 | Discharge: 2020-09-04 | Disposition: A | Discharge status: Home / Self Care | Payer: 59 | Source: Ambulatory Visit | Attending: Internal Medicine | Admitting: Internal Medicine

## 2020-09-04 ENCOUNTER — Other Ambulatory Visit: Payer: Self-pay

## 2020-09-04 DIAGNOSIS — N6489 Other specified disorders of breast: Secondary | ICD-10-CM

## 2020-11-20 ENCOUNTER — Other Ambulatory Visit: Payer: Self-pay | Admitting: Internal Medicine

## 2020-11-21 ENCOUNTER — Other Ambulatory Visit: Payer: Self-pay | Admitting: Internal Medicine

## 2020-11-21 DIAGNOSIS — M79672 Pain in left foot: Secondary | ICD-10-CM

## 2020-11-24 ENCOUNTER — Other Ambulatory Visit: Payer: Self-pay | Admitting: Internal Medicine

## 2020-11-24 DIAGNOSIS — R202 Paresthesia of skin: Secondary | ICD-10-CM

## 2020-12-05 ENCOUNTER — Other Ambulatory Visit: Payer: 59

## 2020-12-05 ENCOUNTER — Inpatient Hospital Stay: Admission: RE | Admit: 2020-12-05 | Payer: 59 | Source: Ambulatory Visit

## 2020-12-06 ENCOUNTER — Ambulatory Visit (INDEPENDENT_AMBULATORY_CARE_PROVIDER_SITE_OTHER): Payer: 59 | Admitting: Neurology

## 2020-12-06 ENCOUNTER — Encounter: Payer: Self-pay | Admitting: Neurology

## 2020-12-06 VITALS — BP 121/75 | HR 80 | Ht 65.0 in | Wt 242.0 lb

## 2020-12-06 DIAGNOSIS — R52 Pain, unspecified: Secondary | ICD-10-CM | POA: Diagnosis not present

## 2020-12-06 DIAGNOSIS — G5690 Unspecified mononeuropathy of unspecified upper limb: Secondary | ICD-10-CM

## 2020-12-06 DIAGNOSIS — M4802 Spinal stenosis, cervical region: Secondary | ICD-10-CM

## 2020-12-06 NOTE — Progress Notes (Signed)
SLEEP MEDICINE CLINIC   Provider:  Melvyn Novas, M D  Primary Care Physician:  Jackie Plum, MD   Referring Provider: [place] Ponder Urge*   Chief Complaint  Patient presents with   consult    Rm 10, alone. Pt presents today for an evaluation, pt c/o of shooting burning, numbness and tingling from shoulder to hand. Ongoing last 3 months, come and goes throughout the day. Pt states arm feels heavy. Pt was prescribed gabapentin 300mg  bid and etodolac 500mg  daily by Dr. and noticed no improvement. Pt was reffered today by Dr. for further evaluation.     HPI:  Grace Quinn is a 55 y.o. female , and seen here  in a referral from Dr. [name]  for a  Evaluation of dyseasthesias.  The patient describes a burning tingling and sometimes shooting electric sensation arising from the neck to the shoulder on the outside of her arm and into the hands.  The palms are not involved the burning sensation or electric shock sensation reaches the back of the hand but all 5 fingers are involved.  She reports that this started about 3 months ago it comes and goes throughout the day even at night.  Dr. 53 had given her etodolac and gabapentin but she has not found this to help her symptoms. MRI was ordered , insurance denied and palladium HC cancelled.  No EMG yet.       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 The patient presented to [facility] March 12, 2017 with a chief complaint of severe headaches at the time.  These headaches have been going on for 2-3 days/week severe described in intensity, photophobia and weakness, but no nausea or vomiting.  The pain is described as sharp, tight, throbbing and sometimes almost explosive.  She was given Imitrex by her physician and has not been further evaluated.  She feels that sleep deprivation or poor night of sleep is No.1 of the trigger factors for headaches Sleep habits are as  follows: she works 11 PM to 7 AM , and works 4 times a week.  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 She lives very close to her workplace and is usually home by 715, she is unable to initiate sleep at that time and mostly waits until about 2 PM when she has a higher chance of actually falling asleep.  Once asleep she will sleep for about 4-5 hours in daytime. She states that she is not completely able to block daylight penetrating the window, but her bedroom is cool and quiet otherwise.  The light seems to wake her and makes it hard to maintain sleep. She rises at 10 PM and starts to get ready for work again. First to sleep on her side, uses a lot of pillows 3 or 4 for head support. She does not snore.  Sleep medical history and family sleep history:  No family history of sleep disorders known.    Social history:  She lives with her husband, no children.  Night shift worker.  No history of smoking, no ETOH, Caffeine : 1 cup a day. No tea, soda or energy drinks.     Review of Systems: Out of a complete 14 system review, the patient complains of only the following symptoms, and all other reviewed systems are negative.    Presenting for arm and shoulder pain, radiculopathy?  Social History   Socioeconomic History   Marital status: Married    Spouse name: [name]   Number of children: 1   Years of education: Not on file   Highest education level: Bachelor's degree (e.g., BA, AB, BS)  Occupational History   Not on file  Tobacco Use   Smoking status: Never   Smokeless tobacco: Never  Substance and Sexual Activity   Alcohol use: No   Drug use: No   Sexual activity: Yes  Other Topics Concern   Not on file  Social History Narrative   Lives with husband   Right handed   Caffeine: no soda, occasional 1 cup of coffe   Social Determinants of Health   Financial Resource Strain: Not on file  Food Insecurity: Not on file   Transportation Needs: Not on file  Physical Activity: Not on file  Stress: Not on file  Social Connections: Not on file  Intimate Partner Violence: Not on file    Family History  Problem Relation Age of Onset   Hypertension Mother     Past Medical History:  Diagnosis Date   Allergy    Headache    Hyperlipemia     Past Surgical History:  Procedure Laterality Date   APPENDECTOMY     APPENDECTOMY     UTERINE FIBROID SURGERY      Current Outpatient Medications  Medication Sig Dispense Refill   cetirizine (ZYRTEC) 10 MG tablet Take 1 tablet (10 mg total) by mouth daily. 30 tablet 11   fluticasone (FLONASE) 50 MCG/ACT nasal spray Place 2 sprays into both nostrils daily. 16 g 6   ibuprofen (ADVIL,MOTRIN) 200 MG tablet Take 200 mg by mouth every 6 (six) hours as needed for pain.     naproxen (NAPROSYN) 500 MG tablet Take 1 tablet (500 mg total) by mouth 2 (two) times daily with a meal. 30 tablet 0   SUMAtriptan (IMITREX) 50 MG tablet Take 1 tablet (50 mg total) by mouth every 2 (two) hours as needed for migraine. May repeat in 2 hours if headache persists or recurs. 10 tablet 2   methocarbamol (ROBAXIN) 500 MG tablet Take 1 tablet (500 mg total) by mouth every 8 (eight) hours as needed for muscle spasms. (Patient not taking: Reported on 12/06/2020) 20 tablet 0   traMADol (ULTRAM) 50 MG tablet Take 1 tablet (50 mg total) by mouth every 8 (eight) hours as needed for severe pain. (Patient not taking: Reported on 12/06/2020) 12 tablet 0   No current facility-administered medications for this visit.    Allergies as of 12/06/2020   (No Known Allergies)    Vitals: BP 121/75   Pulse 80   Ht 5\' 5"  (1.651 m)   Wt 242 lb (109.8 kg)   BMI 40.27 kg/m  Last Weight:  Wt Readings from Last 1 Encounters:  12/06/20 242 lb (109.8 kg)   12/08/20 mass index is 40.27 kg/m.     Last Height:   Ht Readings from Last 1 Encounters:  12/06/20 5\' 5"  (1.651 m)    Physical exam:  General: The  patient is awake, alert and appears not in acute distress. The patient is well groomed. Head: Normocephalic, atraumatic. Neck is supple.  Cardiovascular:  Regular rate and rhythm , without  murmurs or carotid bruit, and without distended neck veins. Respiratory: Lungs are clear to auscultation. Skin:  Without evidence of edema, or rash Trunk: BMI is 40.  The patient's posture is erect   Neurologic exam :  Cranial nerves: Pupils are equal and briskly reactive to light. Extraocular movements  in vertical and horizontal planes intact and without nystagmus. Visual fields by finger perimetry are intact.Hearing to finger rub intact.  Facial sensation intact to fine touch. Facial motor strength is symmetric and tongue and uvula move midline. Shoulder shrug was symmetrical.   Motor exam: Normal tone, muscle bulk and symmetric strength in all extremities. Sensory: The patient reports a trigger point over the scapula and shoulder joint, she does not have any specific tenderness or trigger for pain in the paraspinal cervical region or on the trapezium. Coordination: Rapid alternating movements in the fingers/hands was normal. Finger-to-nose maneuver  normal without evidence of ataxia, dysmetria or tremor. Gait and station: Patient walks without assistive device and is able unassisted to climb up to the exam table. Strength within normal limits. Stance is stable and normal. Deep tendon reflexes: in the  upper and lower extremities are symmetric and intact. Babinski maneuver response is downgoing.  Assessment:  After physical and neurologic examination, review of laboratory studies,  Personal review of imaging studies, reports of other /same  Imaging studies, results of polysomnography and / or neurophysiology testing and pre-existing records as far as provided in visit., my assessment is   1) I suspect this is not a radiculopathy but rather a rotator cuff related nerve impingement.  The sudden onset was an  electric shooting sensation follows a dermatome she also reports that it can be occurring several times a day and she is not always sure that her movement led to it.  She does not identify lifting, pushing or pulling as trigger factors for this pain.   I spent more than 25 minutes of face to face time with the patient.  Greater than 50% of time was spent in counseling and coordination of care. We have discussed the diagnosis and differential and I answered the patient's questions.    Plan:  Treatment plan and additional workup :  The patient feels minimal relief with current medications but some with application of heat.   EMG and NCV for right versus left arm- comparison needed.  Pinched nerve at rotator cuff?  MRI of the shoulder for rotator cuff has been cancelled by her PCP, will order if indicated by NCV. Consider sport medicine consult for rotatorcuff evaluation.   Melvyn Novas, MD 12/06/2020, 2:28 PM  Certified in Neurology by ABPN Certified in Sleep Medicine by [facility] [address] [place], [place] [address]

## 2020-12-06 NOTE — Patient Instructions (Signed)

## 2020-12-15 ENCOUNTER — Other Ambulatory Visit: Payer: Self-pay

## 2020-12-15 ENCOUNTER — Ambulatory Visit
Admission: RE | Admit: 2020-12-15 | Discharge: 2020-12-15 | Disposition: A | Discharge status: Home / Self Care | Payer: 59 | Source: Ambulatory Visit | Attending: Neurology | Admitting: Neurology

## 2020-12-15 DIAGNOSIS — R52 Pain, unspecified: Secondary | ICD-10-CM | POA: Diagnosis not present

## 2020-12-15 DIAGNOSIS — G5690 Unspecified mononeuropathy of unspecified upper limb: Secondary | ICD-10-CM | POA: Diagnosis not present

## 2020-12-18 ENCOUNTER — Telehealth: Payer: Self-pay | Admitting: Neurology

## 2020-12-18 NOTE — Progress Notes (Signed)
At C4-C5, there are bilateral disc osteophyte complexes causing borderline spinal stenosis and moderately severe left and moderate right foraminal narrowing.  There is potential for left C5 nerve root compression. There is some encroachment upon the right C6 nerve root but no definite nerve root compression. 4.   At C6-C7 there is disc protrusion and other degenerative changes causing mild spinal stenosis and mild to moderate bilateral foraminal narrowing but there does not appear to be nerve root compression.  Dr Epimenio Foot interpreted this image on 12-15-2020.  I recommend to see neurosurgery- if alright with the patient , will refer locally unless she wants to see a surgeon in [place]. CD

## 2020-12-18 NOTE — Addendum Note (Signed)
Addended by: Melvyn Novas on: 12/18/2020 01:26 PM   Modules accepted: Orders

## 2020-12-18 NOTE — Telephone Encounter (Signed)
[facility] G. Osei-Bonsu, M.D. called stating that they received the pt's MRI and she has a potential C5 Compression. They called wanting to know if she is to be seen sooner or what is advised.

## 2020-12-18 NOTE — Telephone Encounter (Signed)
Dr Vickey Huger and MD spoke over the phone

## 2020-12-20 ENCOUNTER — Telehealth: Payer: Self-pay | Admitting: Neurology

## 2020-12-20 NOTE — Telephone Encounter (Signed)
LVM for pt to call 12/19/20 and 12/20/20 to call office about MRI results.

## 2020-12-20 NOTE — Telephone Encounter (Signed)
Neurosurgery referral sent to [facility]. They will call patient to schedule. Phone: [phone].

## 2020-12-21 ENCOUNTER — Encounter: Payer: 59 | Admitting: Diagnostic Neuroimaging

## 2021-04-14 ENCOUNTER — Encounter (HOSPITAL_COMMUNITY): Payer: Self-pay

## 2021-04-14 ENCOUNTER — Emergency Department (HOSPITAL_COMMUNITY): Payer: 59

## 2021-04-14 ENCOUNTER — Emergency Department (HOSPITAL_COMMUNITY)
Admission: EM | Admit: 2021-04-14 | Discharge: 2021-04-14 | Disposition: A | Discharge status: Home / Self Care | Payer: 59 | Attending: Emergency Medicine | Admitting: Emergency Medicine

## 2021-04-14 ENCOUNTER — Other Ambulatory Visit: Payer: Self-pay

## 2021-04-14 DIAGNOSIS — M25511 Pain in right shoulder: Secondary | ICD-10-CM | POA: Diagnosis not present

## 2021-04-14 DIAGNOSIS — M25512 Pain in left shoulder: Secondary | ICD-10-CM | POA: Insufficient documentation

## 2021-04-14 DIAGNOSIS — M549 Dorsalgia, unspecified: Secondary | ICD-10-CM

## 2021-04-14 DIAGNOSIS — W010XXA Fall on same level from slipping, tripping and stumbling without subsequent striking against object, initial encounter: Secondary | ICD-10-CM | POA: Insufficient documentation

## 2021-04-14 DIAGNOSIS — Y990 Civilian activity done for income or pay: Secondary | ICD-10-CM | POA: Diagnosis not present

## 2021-04-14 DIAGNOSIS — R0781 Pleurodynia: Secondary | ICD-10-CM | POA: Diagnosis not present

## 2021-04-14 DIAGNOSIS — M545 Low back pain, unspecified: Secondary | ICD-10-CM | POA: Diagnosis present

## 2021-04-14 DIAGNOSIS — W19XXXA Unspecified fall, initial encounter: Secondary | ICD-10-CM

## 2021-04-14 MED ORDER — METHOCARBAMOL 500 MG PO TABS: 500.0000 mg | ORAL_TABLET | Freq: Every evening | ORAL | 0 refills | Status: AC | PRN

## 2021-04-14 MED ORDER — KETOROLAC TROMETHAMINE 15 MG/ML IJ SOLN
15.0000 mg | Freq: Once | INTRAMUSCULAR | Status: AC
  Administered 2021-04-14: 15 mg via INTRAMUSCULAR
  Filled 2021-04-14: qty 1

## 2021-04-14 MED ORDER — NAPROXEN 500 MG PO TABS: 500.0000 mg | ORAL_TABLET | Freq: Two times a day (BID) | ORAL | 0 refills | Status: AC

## 2021-04-14 NOTE — Discharge Instructions (Signed)
Take naproxen 2 times a day with meals.  Do not take other anti-inflammatories at the same time (Advil, Motrin, ibuprofen, Aleve). You may supplement with Tylenol if you need further pain control. Use Robaxin as needed for muscle stiffness or soreness. Have caution, as this may make you tired or groggy. Do not drive or operate heavy machinery while taking this medication.  Use muscle creams (bengay, icy hot, salonpas) as needed for pain.  Do stretches and movement to help with pain.  Follow up with your primary care doctor if pain is not improving with this treatment. Return to the ER if you develop numbness, difficulty breathing, loss of bowel or bladder control, or any new or concerning symptoms.

## 2021-04-14 NOTE — ED Triage Notes (Signed)
Patient slipped and fell yesterday at work. No loc Complains of lower back and coccyx pain.

## 2021-04-14 NOTE — ED Provider Notes (Signed)
[facility] EMERGENCY DEPARTMENT Provider Note   CSN: [id number] Arrival date & time: 04/14/21  1029     History No chief complaint on file.   Grace Quinn is a 55 y.o. female presenting for evaluation after fall.  Patient states yesterday she slipped on some liquid on the floor, falling backwards and landing on her back.  She not hit her head or lose consciousness.  Since then, she has had pain in her back.  Pain is mostly in her low back, however she also has bilateral shoulder pain and pain on the left side of her posterior ribs.  She has not taken anything for pain including Tylenol or ibuprofen.  No headaches or dizziness.  She is not on blood thinners.   HPI     Past Medical History:  Diagnosis Date   Allergy    Headache    Hyperlipemia     Patient Active Problem List   Diagnosis Date Noted   Nerve disorder involving shoulder pain 12/06/2020   Obesity (BMI 35.0-39.9 without comorbidity) 04/29/2017   Episodic cluster headache, not intractable 04/29/2017   Intractable migraine without aura and without status migrainosus 04/29/2017   Sleep disorder, circadian, shift work type 04/29/2017   Papanicolaou smear of cervix with atypical squamous cells of undetermined significance (ASC-US) 02/24/2013   Headache 02/24/2013    Past Surgical History:  Procedure Laterality Date   APPENDECTOMY     APPENDECTOMY     UTERINE FIBROID SURGERY       OB History   No obstetric history on file.     Family History  Problem Relation Age of Onset   Hypertension Mother     Social History   Tobacco Use   Smoking status: Never   Smokeless tobacco: Never  Substance Use Topics   Alcohol use: No   Drug use: No    Home Medications Prior to Admission medications   Medication Sig Start Date End Date Taking? Authorizing Provider  methocarbamol (ROBAXIN) 500 MG tablet Take 1 tablet (500 mg total) by mouth at bedtime as needed for muscle spasms.  04/14/21  Yes Sharon Stapel, PA-C  naproxen (NAPROSYN) 500 MG tablet Take 1 tablet (500 mg total) by mouth 2 (two) times daily with a meal. 04/14/21  Yes Adelayde Minney, PA-C  cetirizine (ZYRTEC) 10 MG tablet Take 1 tablet (10 mg total) by mouth daily. 10/04/14   Ambrose Finland, NP  fluticasone (FLONASE) 50 MCG/ACT nasal spray Place 2 sprays into both nostrils daily. 10/04/14   Ambrose Finland, NP  ibuprofen (ADVIL,MOTRIN) 200 MG tablet Take 200 mg by mouth every 6 (six) hours as needed for pain.    [provider]  SUMAtriptan (IMITREX) 50 MG tablet Take 1 tablet (50 mg total) by mouth every 2 (two) hours as needed for migraine. May repeat in 2 hours if headache persists or recurs. 04/29/17   Dohmeier, Porfirio Mylar, MD  traMADol (ULTRAM) 50 MG tablet Take 1 tablet (50 mg total) by mouth every 8 (eight) hours as needed for severe pain. Patient not taking: Reported on 12/06/2020 11/30/18   Antony Madura, PA-C    Allergies    Patient has no known allergies.  Review of Systems   Review of Systems  Musculoskeletal:  Positive for arthralgias and back pain.  All other systems reviewed and are negative.  Physical Exam Updated Vital Signs BP 139/76 (BP Location: Left Arm)   Pulse 61   Temp 98.2 F (36.8 C) (Oral)  Resp 15   SpO2 99%   Physical Exam Vitals and nursing note reviewed.  Constitutional:      General: She is not in acute distress.    Appearance: Normal appearance.  HENT:     Head: Normocephalic and atraumatic.  Eyes:     Conjunctiva/sclera: Conjunctivae normal.     Pupils: Pupils are equal, round, and reactive to light.  Neck:     Comments: No TTP over midline C-spine.  No step-offs or deformities Cardiovascular:     Rate and Rhythm: Normal rate and regular rhythm.     Pulses: Normal pulses.  Pulmonary:     Effort: Pulmonary effort is normal. No respiratory distress.     Breath sounds: Normal breath sounds. No wheezing.     Comments: Speaking in full sentences.   Clear lung sounds in all fields. Abdominal:     General: There is no distension.     Palpations: Abdomen is soft.     Tenderness: There is no abdominal tenderness.  Musculoskeletal:        General: Tenderness present. Normal range of motion.     Cervical back: Normal range of motion and neck supple.     Comments: Diffuse tenderness outpatient the entire back.  No focal pain over midline spine.  No step-offs or deformities. Increased tenderness palpation of the left posterior lower ribs.   Skin:    General: Skin is warm and dry.     Capillary Refill: Capillary refill takes less than 2 seconds.  Neurological:     Mental Status: She is alert and oriented to person, place, and time.  Psychiatric:        Mood and Affect: Mood and affect normal.        Speech: Speech normal.        Behavior: Behavior normal.    ED Results / Procedures / Treatments   Labs (all labs ordered are listed, but only abnormal results are displayed) Labs Reviewed - No data to display  EKG None  Radiology DG Chest 2 View  Result Date: 04/14/2021 CLINICAL DATA:  Fall earlier today. EXAM: CHEST - 2 VIEW COMPARISON:  None. FINDINGS: The heart size and mediastinal contours are within normal limits. Both lungs are clear. The visualized skeletal structures are unremarkable. IMPRESSION: No active cardiopulmonary disease. Electronically Signed   By: Gerome Sam III M.D.   On: 04/14/2021 17:41   DG Lumbar Spine Complete  Result Date: 04/14/2021 CLINICAL DATA:  Fall earlier today.  Pain. EXAM: LUMBAR SPINE - COMPLETE 4+ VIEW COMPARISON:  None FINDINGS: Multilevel degenerative disc disease is and facet degenerative changes. No other abnormalities. IMPRESSION: No fracture malalignment.  Degenerative changes as above. Electronically Signed   By: Gerome Sam III M.D.   On: 04/14/2021 17:40   DG Sacrum/Coccyx  Result Date: 04/14/2021 CLINICAL DATA:  Fall earlier today. EXAM: SACRUM AND COCCYX - 2+ VIEW  COMPARISON:  None. FINDINGS: There is no evidence of fracture or other focal bone lesions. IMPRESSION: Negative. Electronically Signed   By: Gerome Sam III M.D.   On: 04/14/2021 17:41    Procedures Procedures   Medications Ordered in ED Medications  ketorolac (TORADOL) 15 MG/ML injection 15 mg (15 mg Intramuscular Given 04/14/21 1916)    ED Course  I have reviewed the triage vital signs and the nursing notes.  Pertinent labs & imaging results that were available during my care of the patient were reviewed by me and considered in my medical decision making (  see chart for details).    MDM Rules/Calculators/A&P                           Patient presenting for evaluation back pain after fall.  On exam, patient appears nontoxic  She has diffuse tenderness, likely muscular.  However in the setting of trauma, will obtain imaging.  X-rays viewed and independently interpreted by me, no fracture or dislocation.  Discussed findings with patient.  Discussed treatment for muscular pain.  Discussed follow-up with PCP, and strict return precautions.  At this time, patient appears safe for discharge.  Return precautions given.  Patient states she understands and agrees to plan.   Final Clinical Impression(s) / ED Diagnoses Final diagnoses:  Musculoskeletal back pain  Fall, initial encounter    Rx / DC Orders ED Discharge Orders          Ordered    naproxen (NAPROSYN) 500 MG tablet  2 times daily with meals        04/14/21 1857    methocarbamol (ROBAXIN) 500 MG tablet  At bedtime PRN        04/14/21 1857             Alveria Apley, PA-C 04/14/21 2010    Linwood Dibbles, MD 04/15/21 1317

## 2021-08-28 ENCOUNTER — Other Ambulatory Visit: Payer: Self-pay | Admitting: Physician Assistant

## 2021-08-28 DIAGNOSIS — Z1231 Encounter for screening mammogram for malignant neoplasm of breast: Secondary | ICD-10-CM

## 2021-12-25 ENCOUNTER — Encounter: Payer: Self-pay | Admitting: Acute Care

## 2021-12-25 ENCOUNTER — Ambulatory Visit (INDEPENDENT_AMBULATORY_CARE_PROVIDER_SITE_OTHER): Payer: No Typology Code available for payment source | Admitting: Acute Care

## 2021-12-25 VITALS — BP 108/72 | HR 86 | Temp 98.6°F | Ht 65.0 in | Wt 237.2 lb

## 2021-12-25 DIAGNOSIS — R0683 Snoring: Secondary | ICD-10-CM

## 2021-12-25 DIAGNOSIS — G44009 Cluster headache syndrome, unspecified, not intractable: Secondary | ICD-10-CM | POA: Diagnosis not present

## 2021-12-25 DIAGNOSIS — E669 Obesity, unspecified: Secondary | ICD-10-CM

## 2021-12-25 DIAGNOSIS — G4739 Other sleep apnea: Secondary | ICD-10-CM

## 2021-12-25 NOTE — Patient Instructions (Addendum)
It is good to see you today. We will order a home sleep study to evaluate for sleep apnea.  You will get called to pick up the sleep study device.  You will come and pick it up, be taught how to use it, and you will use it that night . Return the next morning to down Load.  Follow up in 6 -8 weeks after sleep study for follow up to discuss the results.  Follow up with Maralyn Sago NP to review the results Please contact office for sooner follow up if symptoms do not improve or worsen or seek emergency care

## 2021-12-25 NOTE — Progress Notes (Addendum)
History of Present Illness Grace Quinn is a 56 y.o. 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 Vickey Huger)  She is here today to be evaluated for suspected OSA. Referred by Dr. Julio Sicks.    12/25/2021 Pt. Presents for evaluation of suspected sleep disorder breathing. She was referred by Dr. Julio Sicks for morning headaches. She states she has morning headaches and headaches while she is sleeping. She also endorses snoring. She does not have daytime sleepiness. She does wake up through the night, but she states this is because she has to go to the bathroom frequently. . She states she has had a sleep study in the past about 3 years ago. It was negative for OSA and oxygen desaturations ( 2019). She does go to the gym every day. She is working on weight loss.She thinks since she has been going to the gym daily her headaches are better.  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  She does have a high-grade Mallampati, however her neck circumference is in normal range  Test Results: Sleep questionnaire Symptoms- snoring and frequent night time awakenings, am headaches , BMI> 39 Prior sleep study-  2019 Bedtime- 1 am  Time to fall asleep- 10-20 minutes Nocturnal awakenings- 3-4 times  Out of bed/start of day- 8 am  Weight changes- 8 pounds less Do you operate heavy machinery- no Do you currently wear CPAP- no Do you current wear oxygen- no Epworth-  5   Pt had not completed her paperwork, so we did this together during the visit   Sleep Study 09/2017 STUDY RESULTS:  Total Recording Time: 8 hours, 45 minutes. Valid Test time was 5 h and 45 minutes. Total Apnea/Hypopnea Index (AHI):  8.9 /h, RDI: 11.6/h Average Oxygen Saturation: 94 %, Lowest Oxygen Saturation:  74 %.  Total Time in Oxygen Saturation below 89 % was 11.0 minutes.  Average Heart Rate: 60 bpm. Mild Sleep Apnea>> no treatment recommended by ordering physician Recommend weight loss, improvement of nasal patency, and creating an environment conducive to sleep in daytime ( "Dark, Cool and Quiet"). May try melatonin one hour prior to intended sleep time, 5 mg or less)     12/25/2021   11:00 AM  Results of the Epworth flowsheet  Sitting and reading 0  Watching TV 0  Sitting, inactive in a public place (e.g. a theatre or a meeting) 0  As a passenger in a car for an hour without a break 3  Lying down to rest in the afternoon when circumstances permit 2  Sitting and talking to someone 0  Sitting quietly after a lunch without alcohol 0  In a car, while stopped for a few minutes in traffic 0  Total score 5         No data to display              No data to display          BNP No results found for: "BNP"  ProBNP No results found for: "PROBNP"  PFT No results found for: "FEV1PRE", "FEV1POST", "FVCPRE", "FVCPOST", "TLC", "DLCOUNC", "PREFEV1FVCRT", "PSTFEV1FVCRT"  No results found.   Past medical hx Past Medical History:  Diagnosis Date   Allergy    Headache  Hyperlipemia      Social History   Tobacco Use   Smoking status: Never   Smokeless tobacco: Never  Substance Use Topics   Alcohol use: No   Drug use: No    Grace Quinn reports that she has never smoked. She has never used smokeless tobacco. She reports that she does not drink alcohol and does not use drugs.  Tobacco Cessation: Never smoker    Past surgical hx, Family hx, Social hx all reviewed.  Current Outpatient Medications on File Prior to Visit  Medication Sig   cetirizine (ZYRTEC) 10 MG tablet Take 1 tablet (10 mg total) by mouth daily.   fluticasone (FLONASE) 50 MCG/ACT nasal spray Place 2 sprays into both nostrils daily.   gabapentin (NEURONTIN) 300 MG capsule Take 300 mg by mouth 2  (two) times daily.   ibuprofen (ADVIL,MOTRIN) 200 MG tablet Take 200 mg by mouth every 6 (six) hours as needed for pain.   methocarbamol (ROBAXIN) 500 MG tablet Take 1 tablet (500 mg total) by mouth at bedtime as needed for muscle spasms.   naproxen (NAPROSYN) 500 MG tablet Take 1 tablet (500 mg total) by mouth 2 (two) times daily with a meal.   SUMAtriptan (IMITREX) 50 MG tablet Take 1 tablet (50 mg total) by mouth every 2 (two) hours as needed for migraine. May repeat in 2 hours if headache persists or recurs.   traMADol (ULTRAM) 50 MG tablet Take 1 tablet (50 mg total) by mouth every 8 (eight) hours as needed for severe pain. (Patient not taking: Reported on 12/06/2020)   No current facility-administered medications on file prior to visit.     No Known Allergies  Review Of Systems:  Constitutional:   +  weight loss,No  night sweats,  Fevers, chills, fatigue, or  lassitude.  HEENT:   + headaches, daytime and night time,  Difficulty swallowing,  Tooth/dental problems, or  Sore throat,                No sneezing, itching, ear ache, nasal congestion, post nasal drip,   CV:  No chest pain,  Orthopnea, PND, swelling in lower extremities, anasarca, dizziness, palpitations, syncope.   GI  No heartburn, indigestion, abdominal pain, nausea, vomiting, diarrhea, change in bowel habits, loss of appetite, bloody stools.   Resp: No shortness of breath with exertion or at rest.  No excess mucus, no productive cough,  No non-productive cough,  No coughing up of blood.  No change in color of mucus.  No wheezing.  No chest wall deformity  Skin: no rash or lesions.  GU: no dysuria, change in color of urine, no urgency or frequency.  No flank pain, no hematuria   MS:  No joint pain or swelling.  No decreased range of motion.  No back pain.  Psych:  No change in mood or affect. No depression or anxiety.  No memory loss.   Vital Signs BP 108/72 (BP Location: Left Arm, Patient Position: Sitting, Cuff  Size: Large)   Pulse 86   Temp 98.6 F (37 C) (Oral)   Ht 5\' 5"  (1.651 m)   Wt 237 lb 3.2 oz (107.6 kg)   SpO2 97%   BMI 39.47 kg/m    Physical Exam:  General- No distress,  A&Ox3, pleasant ENT: No sinus tenderness, TM clear, pale nasal mucosa, no oral exudate,no post nasal drip, no LAN Cardiac: S1, S2, regular rate and rhythm, no murmur Chest: No wheeze/ rales/ dullness; no accessory muscle use, no  nasal flaring, no sternal retractions Abd.: Soft Non-tender, ND, BS +, Body mass index is 39.47 kg/m. Ext: No clubbing cyanosis, edema Neuro:  normal strength, MAE x 4, A&O x 3 Skin: No rashes, warm , dry and intact, no lesions  Psych: normal mood and behavior   Assessment/Plan Suspected Sleep Apnea AM headaches Snoring  BMI 39.47 Plan We will order a home sleep study to evaluate for sleep apnea.  You will get called to pick up the sleep study device.  You will come and pick it up, be taught how to use it, and you will use it that night . Return the next morning to down Load.  Follow up in 6 -8 weeks after sleep study for follow up to discuss the results.  Follow up with Maralyn Sago NP to review the results Please contact office for sooner follow up if symptoms do not improve or worsen or seek emergency care    I spent 40 minutes dedicated to the care of this patient on the date of this encounter to include pre-visit review of records, face-to-face time with the patient discussing conditions above, post visit ordering of testing, clinical documentation with the electronic health record, making appropriate referrals as documented, and communicating necessary information to the patient's healthcare team.    Bevelyn Ngo, NP 12/25/2021  5:07 PM

## 2021-12-27 NOTE — Progress Notes (Signed)
Reviewed and agree with assessment/plan.   Saylor Murry, MD [facility] 12/27/2021, 9:56 AM Pager:  [phone]  

## 2022-09-11 DIAGNOSIS — R232 Flushing: Secondary | ICD-10-CM | POA: Diagnosis not present

## 2022-09-11 DIAGNOSIS — R7303 Prediabetes: Secondary | ICD-10-CM | POA: Diagnosis not present

## 2022-09-11 DIAGNOSIS — I10 Essential (primary) hypertension: Secondary | ICD-10-CM | POA: Diagnosis not present

## 2022-09-11 DIAGNOSIS — G44209 Tension-type headache, unspecified, not intractable: Secondary | ICD-10-CM | POA: Diagnosis not present

## 2022-09-11 DIAGNOSIS — J301 Allergic rhinitis due to pollen: Secondary | ICD-10-CM | POA: Diagnosis not present

## 2022-09-11 DIAGNOSIS — E7800 Pure hypercholesterolemia, unspecified: Secondary | ICD-10-CM | POA: Diagnosis not present

## 2022-09-11 DIAGNOSIS — E559 Vitamin D deficiency, unspecified: Secondary | ICD-10-CM | POA: Diagnosis not present

## 2022-11-05 DIAGNOSIS — R232 Flushing: Secondary | ICD-10-CM | POA: Diagnosis not present

## 2022-11-05 DIAGNOSIS — R7303 Prediabetes: Secondary | ICD-10-CM | POA: Diagnosis not present

## 2022-11-05 DIAGNOSIS — E559 Vitamin D deficiency, unspecified: Secondary | ICD-10-CM | POA: Diagnosis not present

## 2022-11-05 DIAGNOSIS — I10 Essential (primary) hypertension: Secondary | ICD-10-CM | POA: Diagnosis not present

## 2022-11-05 DIAGNOSIS — Z0001 Encounter for general adult medical examination with abnormal findings: Secondary | ICD-10-CM | POA: Diagnosis not present

## 2022-11-05 DIAGNOSIS — J301 Allergic rhinitis due to pollen: Secondary | ICD-10-CM | POA: Diagnosis not present

## 2022-11-05 DIAGNOSIS — G44209 Tension-type headache, unspecified, not intractable: Secondary | ICD-10-CM | POA: Diagnosis not present

## 2022-11-05 DIAGNOSIS — E7800 Pure hypercholesterolemia, unspecified: Secondary | ICD-10-CM | POA: Diagnosis not present

## 2022-11-06 DIAGNOSIS — R232 Flushing: Secondary | ICD-10-CM | POA: Diagnosis not present

## 2022-11-06 DIAGNOSIS — E559 Vitamin D deficiency, unspecified: Secondary | ICD-10-CM | POA: Diagnosis not present

## 2022-11-06 DIAGNOSIS — Z0001 Encounter for general adult medical examination with abnormal findings: Secondary | ICD-10-CM | POA: Diagnosis not present

## 2022-11-06 DIAGNOSIS — E7800 Pure hypercholesterolemia, unspecified: Secondary | ICD-10-CM | POA: Diagnosis not present

## 2022-11-06 DIAGNOSIS — I10 Essential (primary) hypertension: Secondary | ICD-10-CM | POA: Diagnosis not present

## 2022-11-06 DIAGNOSIS — R7303 Prediabetes: Secondary | ICD-10-CM | POA: Diagnosis not present

## 2022-11-06 DIAGNOSIS — J301 Allergic rhinitis due to pollen: Secondary | ICD-10-CM | POA: Diagnosis not present

## 2022-11-06 DIAGNOSIS — G44209 Tension-type headache, unspecified, not intractable: Secondary | ICD-10-CM | POA: Diagnosis not present

## 2022-11-19 DIAGNOSIS — E559 Vitamin D deficiency, unspecified: Secondary | ICD-10-CM | POA: Diagnosis not present

## 2022-11-19 DIAGNOSIS — R232 Flushing: Secondary | ICD-10-CM | POA: Diagnosis not present

## 2022-11-19 DIAGNOSIS — Z124 Encounter for screening for malignant neoplasm of cervix: Secondary | ICD-10-CM | POA: Diagnosis not present

## 2022-11-19 DIAGNOSIS — E7800 Pure hypercholesterolemia, unspecified: Secondary | ICD-10-CM | POA: Diagnosis not present

## 2022-11-19 DIAGNOSIS — J301 Allergic rhinitis due to pollen: Secondary | ICD-10-CM | POA: Diagnosis not present

## 2022-11-19 DIAGNOSIS — R7303 Prediabetes: Secondary | ICD-10-CM | POA: Diagnosis not present

## 2022-11-19 DIAGNOSIS — I10 Essential (primary) hypertension: Secondary | ICD-10-CM | POA: Diagnosis not present

## 2022-11-19 DIAGNOSIS — G44209 Tension-type headache, unspecified, not intractable: Secondary | ICD-10-CM | POA: Diagnosis not present

## 2022-11-20 ENCOUNTER — Other Ambulatory Visit: Payer: Self-pay | Admitting: Physician Assistant

## 2022-11-20 DIAGNOSIS — Z1231 Encounter for screening mammogram for malignant neoplasm of breast: Secondary | ICD-10-CM

## 2022-12-19 ENCOUNTER — Ambulatory Visit
Admission: RE | Admit: 2022-12-19 | Discharge: 2022-12-19 | Disposition: A | Discharge status: Home / Self Care | Payer: 59 | Source: Ambulatory Visit | Attending: Physician Assistant | Admitting: Physician Assistant

## 2022-12-19 ENCOUNTER — Encounter: Payer: Self-pay | Admitting: Radiology

## 2022-12-19 DIAGNOSIS — Z1231 Encounter for screening mammogram for malignant neoplasm of breast: Secondary | ICD-10-CM

## 2022-12-26 DIAGNOSIS — I10 Essential (primary) hypertension: Secondary | ICD-10-CM | POA: Diagnosis not present

## 2022-12-26 DIAGNOSIS — G44209 Tension-type headache, unspecified, not intractable: Secondary | ICD-10-CM | POA: Diagnosis not present

## 2022-12-26 DIAGNOSIS — E7800 Pure hypercholesterolemia, unspecified: Secondary | ICD-10-CM | POA: Diagnosis not present

## 2022-12-26 DIAGNOSIS — E559 Vitamin D deficiency, unspecified: Secondary | ICD-10-CM | POA: Diagnosis not present

## 2022-12-26 DIAGNOSIS — R232 Flushing: Secondary | ICD-10-CM | POA: Diagnosis not present

## 2022-12-26 DIAGNOSIS — J301 Allergic rhinitis due to pollen: Secondary | ICD-10-CM | POA: Diagnosis not present

## 2022-12-26 DIAGNOSIS — R7303 Prediabetes: Secondary | ICD-10-CM | POA: Diagnosis not present

## 2022-12-30 DIAGNOSIS — B349 Viral infection, unspecified: Secondary | ICD-10-CM | POA: Diagnosis not present

## 2022-12-30 DIAGNOSIS — U071 COVID-19: Secondary | ICD-10-CM | POA: Diagnosis not present

## 2023-01-06 DIAGNOSIS — U099 Post covid-19 condition, unspecified: Secondary | ICD-10-CM | POA: Diagnosis not present

## 2023-01-06 DIAGNOSIS — M5441 Lumbago with sciatica, right side: Secondary | ICD-10-CM | POA: Diagnosis not present

## 2023-03-12 DIAGNOSIS — R7303 Prediabetes: Secondary | ICD-10-CM | POA: Diagnosis not present

## 2023-03-12 DIAGNOSIS — E559 Vitamin D deficiency, unspecified: Secondary | ICD-10-CM | POA: Diagnosis not present

## 2023-03-12 DIAGNOSIS — R0789 Other chest pain: Secondary | ICD-10-CM | POA: Diagnosis not present

## 2023-03-12 DIAGNOSIS — G44209 Tension-type headache, unspecified, not intractable: Secondary | ICD-10-CM | POA: Diagnosis not present

## 2023-03-12 DIAGNOSIS — J301 Allergic rhinitis due to pollen: Secondary | ICD-10-CM | POA: Diagnosis not present

## 2023-03-12 DIAGNOSIS — I10 Essential (primary) hypertension: Secondary | ICD-10-CM | POA: Diagnosis not present

## 2023-03-12 DIAGNOSIS — E7800 Pure hypercholesterolemia, unspecified: Secondary | ICD-10-CM | POA: Diagnosis not present

## 2023-03-12 DIAGNOSIS — R232 Flushing: Secondary | ICD-10-CM | POA: Diagnosis not present

## 2023-03-19 DIAGNOSIS — G44209 Tension-type headache, unspecified, not intractable: Secondary | ICD-10-CM | POA: Diagnosis not present

## 2023-03-19 DIAGNOSIS — R7303 Prediabetes: Secondary | ICD-10-CM | POA: Diagnosis not present

## 2023-03-19 DIAGNOSIS — E559 Vitamin D deficiency, unspecified: Secondary | ICD-10-CM | POA: Diagnosis not present

## 2023-03-19 DIAGNOSIS — J301 Allergic rhinitis due to pollen: Secondary | ICD-10-CM | POA: Diagnosis not present

## 2023-03-19 DIAGNOSIS — R232 Flushing: Secondary | ICD-10-CM | POA: Diagnosis not present

## 2023-03-19 DIAGNOSIS — E7800 Pure hypercholesterolemia, unspecified: Secondary | ICD-10-CM | POA: Diagnosis not present

## 2023-03-19 DIAGNOSIS — I10 Essential (primary) hypertension: Secondary | ICD-10-CM | POA: Diagnosis not present
